# Patient Record
Sex: Female | Born: 1945
Health system: Southern US, Community
[De-identification: ages and names within clinical notes are randomized; demographics above are authoritative.]

## PROBLEM LIST (undated history)

## (undated) DIAGNOSIS — E119 Type 2 diabetes mellitus without complications: Secondary | ICD-10-CM

## (undated) DIAGNOSIS — G4730 Sleep apnea, unspecified: Secondary | ICD-10-CM

## (undated) DIAGNOSIS — E785 Hyperlipidemia, unspecified: Secondary | ICD-10-CM

## (undated) DIAGNOSIS — E039 Hypothyroidism, unspecified: Secondary | ICD-10-CM

## (undated) HISTORY — DX: Hyperlipidemia, unspecified: E78.5

## (undated) HISTORY — DX: Sleep apnea, unspecified: G47.30

## (undated) HISTORY — PX: HYSTERECTOMY ABDOMINAL WITH SALPINGO-OOPHORECTOMY: SHX6792

## (undated) HISTORY — DX: Type 2 diabetes mellitus without complications: E11.9

## (undated) HISTORY — DX: Hypothyroidism, unspecified: E03.9

---

## 2016-03-15 ENCOUNTER — Other Ambulatory Visit: Payer: Self-pay | Admitting: Family

## 2016-03-15 DIAGNOSIS — Z1231 Encounter for screening mammogram for malignant neoplasm of breast: Secondary | ICD-10-CM

## 2016-03-22 ENCOUNTER — Ambulatory Visit
Admission: RE | Admit: 2016-03-22 | Discharge: 2016-03-22 | Disposition: A | Discharge status: Home / Self Care | Payer: Medicare Other | Source: Ambulatory Visit | Attending: Family | Admitting: Family

## 2016-03-22 DIAGNOSIS — Z1231 Encounter for screening mammogram for malignant neoplasm of breast: Secondary | ICD-10-CM

## 2016-03-22 IMAGING — MG DIGITAL SCREENING BILATERAL MAMMOGRAM WITH CAD
7 series · 7 of 7 positions shown · non-contrast
Comparison: None.

CLINICAL DATA: Screening.

EXAM:
DIGITAL SCREENING BILATERAL MAMMOGRAM WITH CAD

[L CC (1 of 2)]
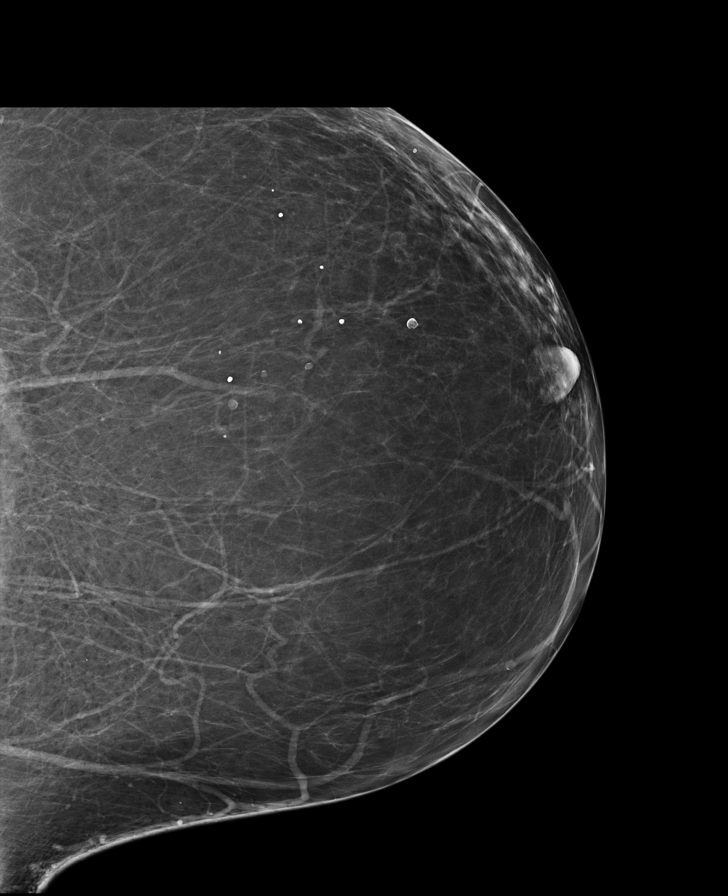

[R MLO (1 of 2)]
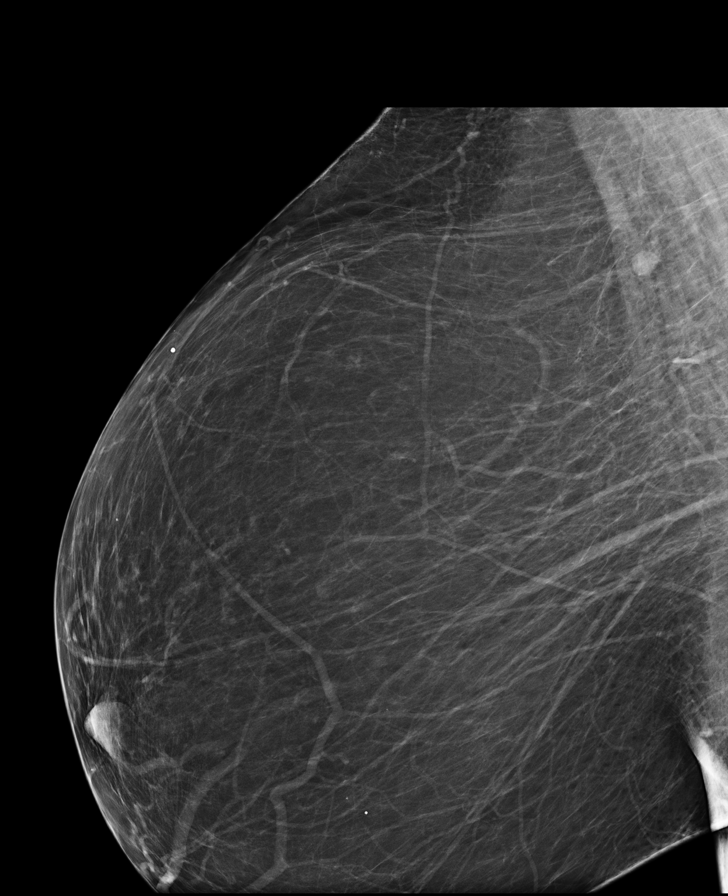

[R CC (1 of 2)]
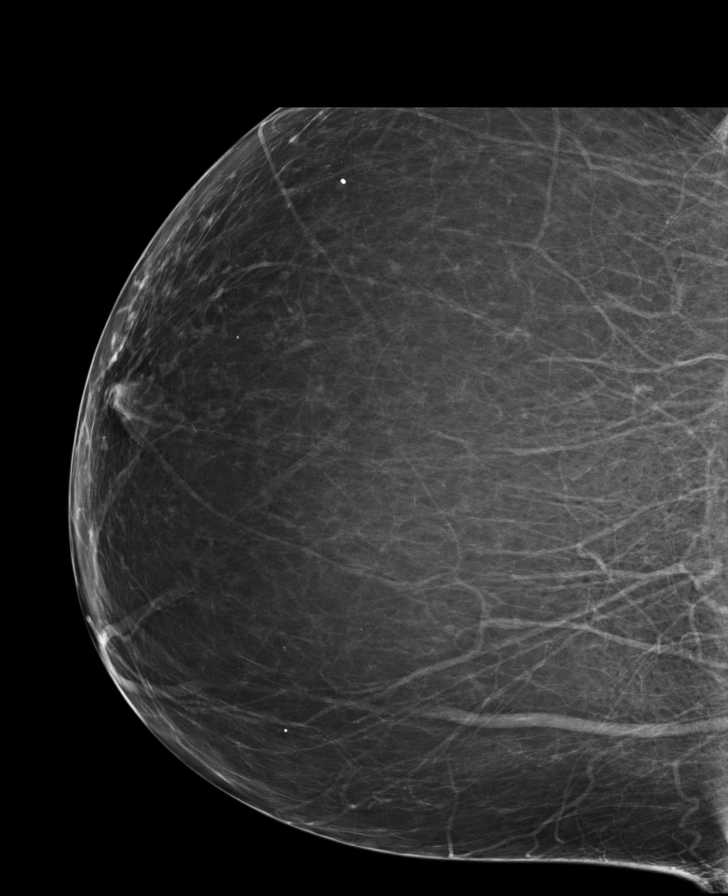

[R CC (2 of 2)]
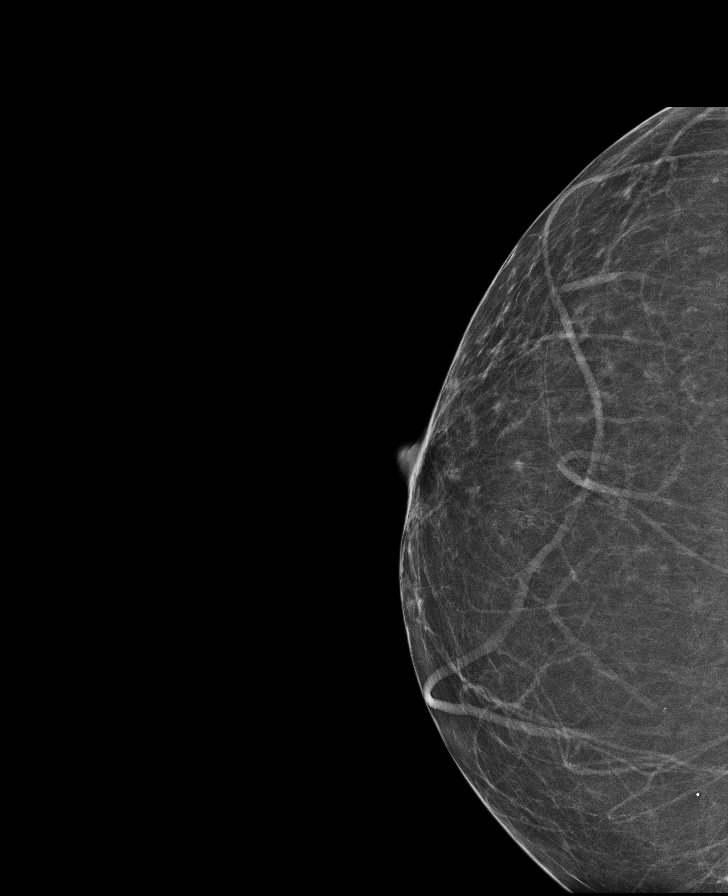

[L MLO]
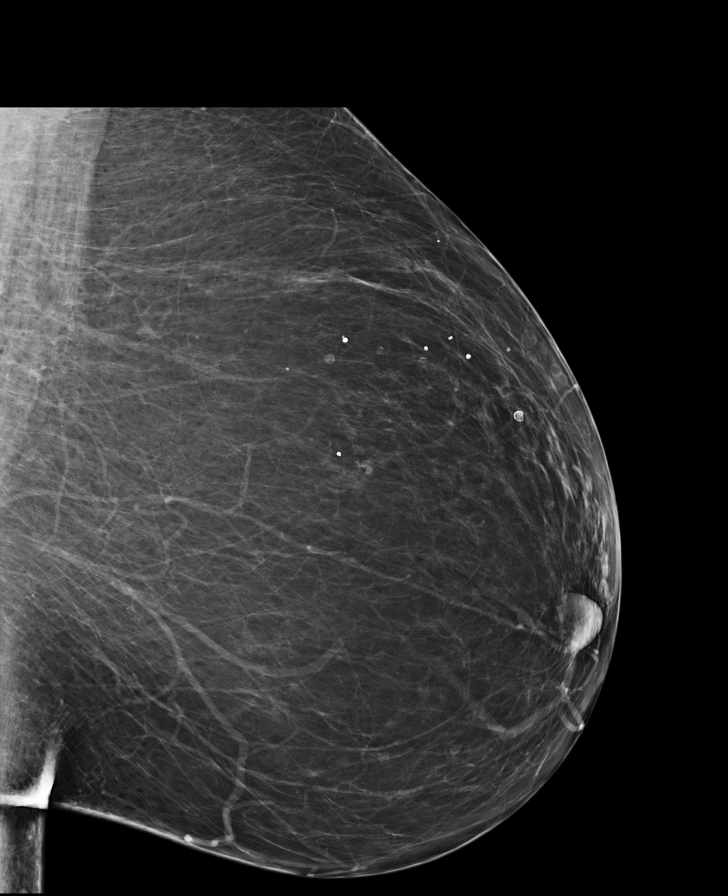

[R MLO (2 of 2)]
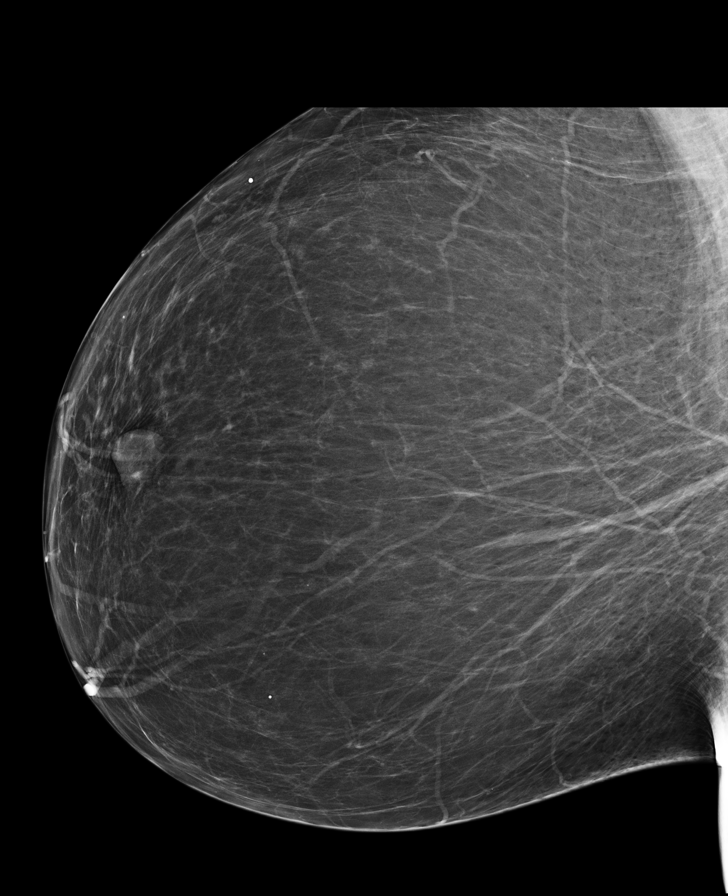

[L CC (2 of 2)]
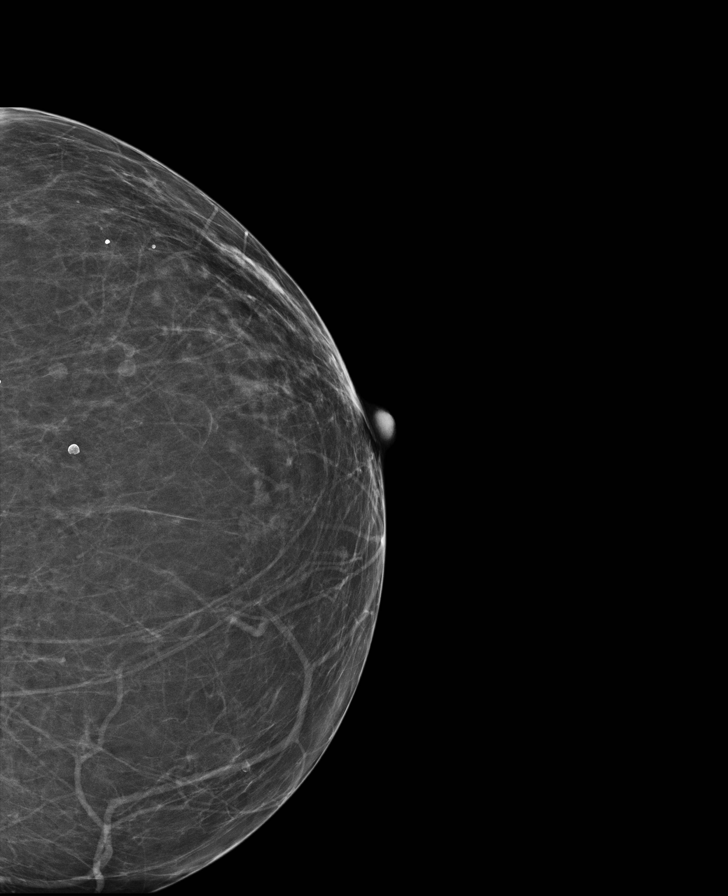

[7 of 7 positions shown; findings below may reference images not displayed]

ACR Breast Density Category b: There are scattered areas of
fibroglandular density.
FINDINGS: There are no findings suspicious for malignancy. Images were
processed with CAD.
IMPRESSION: No mammographic evidence of malignancy. A result letter of this
screening mammogram will be mailed directly to the patient.

RECOMMENDATION:
Screening mammogram in one year. (Code:[GD])

BI-RADS CATEGORY  1: Negative.

## 2016-08-08 ENCOUNTER — Ambulatory Visit: Payer: Medicare Other

## 2016-08-15 ENCOUNTER — Ambulatory Visit: Payer: Medicare Other

## 2016-11-07 ENCOUNTER — Ambulatory Visit (INDEPENDENT_AMBULATORY_CARE_PROVIDER_SITE_OTHER): Payer: Medicare Other | Admitting: Endocrinology

## 2016-11-07 ENCOUNTER — Encounter: Payer: Self-pay | Admitting: Endocrinology

## 2016-11-07 DIAGNOSIS — E785 Hyperlipidemia, unspecified: Secondary | ICD-10-CM | POA: Insufficient documentation

## 2016-11-07 DIAGNOSIS — E890 Postprocedural hypothyroidism: Secondary | ICD-10-CM

## 2016-11-07 DIAGNOSIS — E039 Hypothyroidism, unspecified: Secondary | ICD-10-CM | POA: Insufficient documentation

## 2016-11-07 DIAGNOSIS — E119 Type 2 diabetes mellitus without complications: Secondary | ICD-10-CM

## 2016-11-07 DIAGNOSIS — G4730 Sleep apnea, unspecified: Secondary | ICD-10-CM | POA: Insufficient documentation

## 2016-11-07 DIAGNOSIS — R748 Abnormal levels of other serum enzymes: Secondary | ICD-10-CM | POA: Diagnosis not present

## 2016-11-07 LAB — BASIC METABOLIC PANEL
BUN: 20 mg/dL (ref 6–23)
CO2: 31 mEq/L (ref 19–32)
Calcium: 10.1 mg/dL (ref 8.4–10.5)
Chloride: 103 mEq/L (ref 96–112)
Creatinine, Ser: 1.16 mg/dL (ref 0.40–1.20)
GFR: 59.22 mL/min — ABNORMAL LOW (ref >60.00)
Glucose, Bld: 133 mg/dL — ABNORMAL HIGH (ref 70–99)
Potassium: 3.8 mEq/L (ref 3.5–5.1)
Sodium: 140 mEq/L (ref 135–145)

## 2016-11-07 NOTE — Progress Notes (Signed)
   Subjective:    Patient ID: Karen Black, female    DOB: 09/10/1945, 71 y.o.   MRN: [id number]  HPI Pt is referred by Dorothy Scifres, PA, for elevated alk phos.  This was noted last month.  She denies h/o the following: bariatric surgery, renal disease, seizures, pancreatitis, heart disease, cancer, osteoporosis, malabsorption, eating disorder, bony fx, neck surgery, chelation rx, and vitamin-D deficiency.  She has slight cold intolerance throughout the body, and assoc fatigue.  She does not take a vit-D supplement.    Past Medical History:  Diagnosis Date  . Diabetes ([facility])   . Dyslipidemia   . Hypothyroidism   . Sleep apnea     No past surgical history on file.  Social History   Social History  . Marital status: Divorced    Spouse name: N/A  . Number of children: N/A  . Years of education: N/A   Occupational History  . Not on file.   Social History Main Topics  . Smoking status: Former Smoker  . Smokeless tobacco: Never Used  . Alcohol use No  . Drug use: Unknown  . Sexual activity: Not on file   Other Topics Concern  . Not on file   Social History Narrative  . No narrative on file    No current outpatient prescriptions on file prior to visit.   No current facility-administered medications on file prior to visit.     No Known Allergies  No family history on file.  BP 132/86   Pulse 66   Ht 5' 4.5" (1.638 m)   Wt 208 lb (94.3 kg)   SpO2 90%   BMI 35.15 kg/m    Review of Systems denies cramps, muscle weakness, n/v, syncope, menopausal sxs, palpitations, rash, diarrhea, sob, weight loss, numbness, fever, urinary frequency, rhinorrhea, easy bruising, and blurry vision.      Objective:   Physical Exam VS: see vs page GEN: no distress HEAD: head: no deformity eyes: no periorbital swelling; moderate bilateral proptosis.   external nose and ears are normal mouth: no lesion seen NECK: supple, thyroid is not enlarged CHEST WALL: no deformity LUNGS:  clear to auscultation CV: reg rate and rhythm, no murmur ABD: abdomen is soft, nontender.  no hepatosplenomegaly.  not distended.  no hernia MUSCULOSKELETAL: muscle bulk and strength are grossly normal.  no obvious joint swelling.  gait is normal and steady EXTEMITIES: no deformity.  no edema PULSES: no carotid bruit NEURO:  cn 2-12 grossly intact.   readily moves all 4's.  sensation is intact to touch on all 4's SKIN:  Normal texture and temperature.  No rash or suspicious lesion is visible.   NODES:  None palpable at the neck PSYCH: alert, well-oriented.  Does not appear anxious nor depressed.   Our office has called PCP office, to request more records  outside test results are reviewed: AP=185 GGT=24 PTH=24 25-OH vit-D=40 TSH=normal     Assessment & Plan:  Elevated alk phos, new, uncertain etiology.  w/u needed. Fatigue and other sxs, not thyroid-related.   Patient Instructions  blood tests are requested for you today.  We'll let you know about the results. Let's check an ultrasound.  you will receive a phone call, about a day and time for an appointment   

## 2016-11-07 NOTE — Patient Instructions (Signed)
blood tests are requested for you today.  We'll let you know about the results. Let's check an ultrasound.  you will receive a phone call, about a day and time for an appointment

## 2016-11-10 ENCOUNTER — Other Ambulatory Visit: Payer: Self-pay | Admitting: Endocrinology

## 2016-11-10 DIAGNOSIS — R748 Abnormal levels of other serum enzymes: Secondary | ICD-10-CM

## 2016-11-10 LAB — ALKALINE PHOSPHATASE ISOENZYMES
Alkaline Phonsphatase: 173 U/L — ABNORMAL HIGH (ref 33–130)
Bone Isoenzymes: 70 % — ABNORMAL HIGH (ref 28–66)
Intestinal Isoenzymes: 2 % (ref 1–24)
Liver Isoenzymes: 28 % (ref 25–69)

## 2016-11-13 ENCOUNTER — Other Ambulatory Visit: Payer: Self-pay | Admitting: Endocrinology

## 2016-11-13 DIAGNOSIS — R748 Abnormal levels of other serum enzymes: Secondary | ICD-10-CM

## 2016-11-21 ENCOUNTER — Ambulatory Visit
Admission: RE | Admit: 2016-11-21 | Discharge: 2016-11-21 | Disposition: A | Discharge status: Home / Self Care | Payer: Medicare Other | Source: Ambulatory Visit | Attending: Endocrinology | Admitting: Endocrinology

## 2016-11-21 DIAGNOSIS — R748 Abnormal levels of other serum enzymes: Secondary | ICD-10-CM

## 2016-11-27 ENCOUNTER — Other Ambulatory Visit: Payer: Self-pay | Admitting: Endocrinology

## 2016-11-27 ENCOUNTER — Telehealth: Payer: Self-pay

## 2016-11-27 ENCOUNTER — Encounter (HOSPITAL_COMMUNITY)
Admission: RE | Admit: 2016-11-27 | Discharge: 2016-11-27 | Disposition: A | Discharge status: Home / Self Care | Payer: Medicare Other | Source: Ambulatory Visit | Attending: Endocrinology | Admitting: Endocrinology

## 2016-11-27 DIAGNOSIS — R748 Abnormal levels of other serum enzymes: Secondary | ICD-10-CM | POA: Diagnosis not present

## 2016-11-27 DIAGNOSIS — R948 Abnormal results of function studies of other organs and systems: Secondary | ICD-10-CM | POA: Diagnosis not present

## 2016-11-27 IMAGING — NM NM BONE WHOLE BODY
2 series · 2 of 2 positions shown · non-contrast
Comparison: None.

CLINICAL DATA: Elevated alkaline phosphatase

EXAM:
NUCLEAR MEDICINE WHOLE BODY BONE SCAN
TECHNIQUE: Whole body anterior and posterior images were obtained approximately
3 hours after intravenous injection of radiopharmaceutical.
RADIOPHARMACEUTICALS:  21.3 mCi [52] MDP IV

[Series 1: wbr_bone_40 whole body · 2.66mm/px · 1 of 1 slices shown (1 of 2)]
[im 1/1]
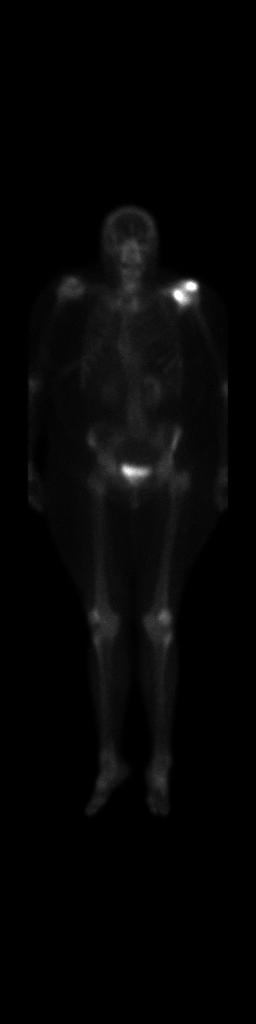

[Series 1: wbr_bone_40 whole body · 2.66mm/px · 1 of 1 slices shown (2 of 2)]
[im 1/1]
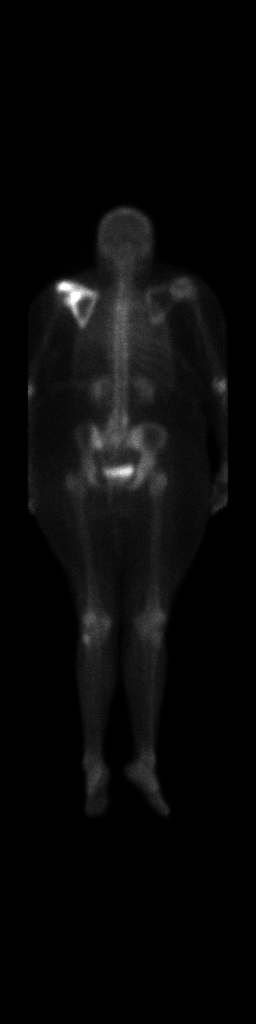

[2 of 2 positions shown; findings below may reference images not displayed]

FINDINGS: There is adequate uptake of radioactive tracer throughout the bony
skeleton. Bilateral renal activity is noted with pooling in the
urinary bladder. Diffuse degenerative changes are noted throughout
the skeletal system.

There is significant increased uptake involving the left scapula
diffusely along its margin but most particularly in the region of
the acromion and glenoid. No other significant focal area of
increased uptake is noted.
IMPRESSION: Increased uptake within the left scapula particularly within the
region of the glenoid and acromion. Correlation with plain film of
the shoulder is recommended. Cross-sectional imaging may be
necessary as well.

These results will be called to the ordering clinician or
representative by the Radiologist Assistant, and communication
documented in the PACS or zVision Dashboard.

## 2016-11-27 MED ORDER — TECHNETIUM TC 99M MEDRONATE IV KIT
25.0000 | PACK | Freq: Once | INTRAVENOUS | Status: AC | PRN
  Administered 2016-11-27: 21.3 via INTRAVENOUS

## 2016-11-27 NOTE — Telephone Encounter (Signed)
Call report from [facility] for the [facility] Whole Body Scan:  Impression showed increase uptake between the left scapula. Particular the region of the glenoid and acromion. Correlation of plain films is recommended and cross sectional imaging maybe neccessary as well.

## 2016-12-05 ENCOUNTER — Telehealth: Payer: Self-pay

## 2016-12-05 NOTE — Telephone Encounter (Signed)
Ok.  I have ordered

## 2016-12-05 NOTE — Telephone Encounter (Signed)
Notes recorded by Renato Shin, MD on 11/27/2016 at 6:25 PM EDT please call patient: Normal except at the left shoulder blade. Have you ever had problems there? Please go back to x-ray to have a regular x-ray taken of that area. I'll let you know what we find.  Patient notified of message and stated she has been experiencing some pain in the left shoulder. She agreed to have the regular x-ray.

## 2016-12-18 ENCOUNTER — Telehealth: Payer: Self-pay | Admitting: Endocrinology

## 2016-12-18 NOTE — Telephone Encounter (Signed)
Pt is asking about the xrays she thought she was to have, there is no order please advise

## 2016-12-19 ENCOUNTER — Telehealth: Payer: Self-pay

## 2016-12-19 NOTE — Telephone Encounter (Signed)
according to epic, the x-ray was ordered on 11/27/16.  Please go to x-ray to have done.

## 2016-12-19 NOTE — Telephone Encounter (Signed)
Called and notified patient.

## 2016-12-19 NOTE — Telephone Encounter (Signed)
Please advise. Thank you

## 2016-12-19 NOTE — Telephone Encounter (Signed)
Called and advised patient that order was placed, she understood and will wait for them to contact her.

## 2017-07-19 ENCOUNTER — Ambulatory Visit: Payer: Medicare Other | Admitting: Endocrinology

## 2017-07-19 ENCOUNTER — Ambulatory Visit
Admission: RE | Admit: 2017-07-19 | Discharge: 2017-07-19 | Disposition: A | Discharge status: Home / Self Care | Payer: Medicare Other | Source: Ambulatory Visit | Attending: Endocrinology | Admitting: Endocrinology

## 2017-07-19 ENCOUNTER — Encounter: Payer: Self-pay | Admitting: Endocrinology

## 2017-07-19 VITALS — BP 132/70 | HR 62 | Wt 203.2 lb

## 2017-07-19 DIAGNOSIS — M889 Osteitis deformans of unspecified bone: Secondary | ICD-10-CM

## 2017-07-19 DIAGNOSIS — R748 Abnormal levels of other serum enzymes: Secondary | ICD-10-CM

## 2017-07-19 LAB — HEPATIC FUNCTION PANEL
ALT: 13 U/L (ref 0–35)
AST: 19 U/L (ref 0–37)
Albumin: 4.1 g/dL (ref 3.5–5.2)
Alkaline Phosphatase: 169 U/L — ABNORMAL HIGH (ref 39–117)
Bilirubin, Direct: 0.2 mg/dL (ref 0.0–0.3)
Total Bilirubin: 0.6 mg/dL (ref 0.2–1.2)
Total Protein: 7.6 g/dL (ref 6.0–8.3)

## 2017-07-19 LAB — VITAMIN D 25 HYDROXY (VIT D DEFICIENCY, FRACTURES): VITD: 36.86 ng/mL (ref 30.00–100.00)

## 2017-07-19 NOTE — Progress Notes (Signed)
Subjective:    Patient ID: Karen Black, female    DOB: 07-02-1946, 72 y.o.   MRN: [id number]  HPI  Pt returns for f/u of elevated alk phos (dx'ed 2018; isoenzymes indicated bone cause, but vit-d, PTH, and creat are normal; bone scan was abnormal at the left shoulder; hepatic US showed only NASH).  She did not do the x-ray.  pt denies left shoulder pain.   Past Medical History:  Diagnosis Date  . Diabetes ([place])   . Dyslipidemia   . Hypothyroidism   . Sleep apnea     History reviewed. No pertinent surgical history.  Social History   Socioeconomic History  . Marital status: Divorced    Spouse name: Not on file  . Number of children: Not on file  . Years of education: Not on file  . Highest education level: Not on file  Social Needs  . Financial resource strain: Not on file  . Food insecurity - worry: Not on file  . Food insecurity - inability: Not on file  . Transportation needs - medical: Not on file  . Transportation needs - non-medical: Not on file  Occupational History  . Not on file  Tobacco Use  . Smoking status: Former Research scientist (life sciences)  . Smokeless tobacco: Never Used  Substance and Sexual Activity  . Alcohol use: No  . Drug use: Not on file  . Sexual activity: Not on file  Other Topics Concern  . Not on file  Social History Narrative  . Not on file    Current Outpatient Medications on File Prior to Visit  Medication Sig Dispense Refill  . amLODipine (NORVASC) 5 MG tablet Take 5 mg by mouth daily.    Marland Kitchen atorvastatin (LIPITOR) 10 MG tablet     . levothyroxine (SYNTHROID, LEVOTHROID) 75 MCG tablet     . metFORMIN (GLUCOPHAGE-XR) 500 MG 24 hr tablet Take 500 mg by mouth every morning.    . propranolol ER (INDERAL LA) 160 MG SR capsule     . quinapril (ACCUPRIL) 40 MG tablet 40 mg 2 (two) times daily.     Marland Kitchen triamterene-hydrochlorothiazide (DYAZIDE) 37.5-25 MG capsule Take 1 capsule by mouth daily.     No current facility-administered medications on file prior to  visit.     No Known Allergies  History reviewed. No pertinent family history.  BP 132/70 (BP Location: Left Arm, Patient Position: Sitting, Cuff Size: Normal)   Pulse 62   Wt 203 lb 3.2 oz (92.2 kg)   SpO2 97%   BMI 34.34 kg/m    Review of Systems She has leg cramps.      Objective:   Physical Exam VITAL SIGNS:  See vs page GENERAL: no distress Left shoulder: full rom without pain.  Nontender.    X-rays: paget's    Assessment & Plan:  Paget's Dz, new.  No treatment is needed now  Patient Instructions  blood tests and x-rays are requested for you today.  We'll let you know about the results.   The fatty liver seen on ultrasound is a separate problem.  Weight loss helps.   Please come back for a follow-up appointment in 6 months.

## 2017-07-19 NOTE — Patient Instructions (Addendum)
blood tests and x-rays are requested for you today.  We'll let you know about the results.   The fatty liver seen on ultrasound is a separate problem.  Weight loss helps.   Please come back for a follow-up appointment in 6 months.

## 2017-07-20 LAB — PTH, INTACT AND CALCIUM
Calcium: 9.9 mg/dL (ref 8.6–10.4)
PTH: 27 pg/mL (ref 14–64)

## 2017-07-21 DIAGNOSIS — M889 Osteitis deformans of unspecified bone: Secondary | ICD-10-CM | POA: Insufficient documentation

## 2017-07-23 ENCOUNTER — Telehealth: Payer: Self-pay | Admitting: Endocrinology

## 2017-07-23 NOTE — Telephone Encounter (Signed)
Patient returning Sarah's call- she is waiting for a call back re: her lab/test results Ph# [phone]

## 2017-07-23 NOTE — Telephone Encounter (Signed)
I called & notified patient of her lab results.

## 2017-07-23 NOTE — Telephone Encounter (Signed)
Pt returned call about getting her results.   Please Advise

## 2017-10-08 ENCOUNTER — Other Ambulatory Visit: Payer: Self-pay | Admitting: Family Medicine

## 2017-10-08 DIAGNOSIS — Z1231 Encounter for screening mammogram for malignant neoplasm of breast: Secondary | ICD-10-CM

## 2017-10-22 ENCOUNTER — Encounter: Payer: Self-pay | Admitting: Endocrinology

## 2017-10-22 ENCOUNTER — Ambulatory Visit: Payer: Medicare Other | Admitting: Endocrinology

## 2017-10-22 VITALS — BP 152/80 | HR 60 | Wt 200.2 lb

## 2017-10-22 DIAGNOSIS — M889 Osteitis deformans of unspecified bone: Secondary | ICD-10-CM | POA: Diagnosis not present

## 2017-10-22 NOTE — Progress Notes (Signed)
Subjective:    Patient ID: Karen Black, female    DOB: 1945-07-28, 72 y.o.   MRN: [id number]  HPI Pt returns for f/u of paget's Dz (dx'ed 2018, when AP was high; isoenzymes indicated bone cause, but Vit-D, PTH, and creat were normal; bone scan was abnormal only at the left shoulder; hepatic US showed only NASH; plain films showed Paget's).  Pt says left shoulder is painless.  She missed her last dose of BP meds.   Past Medical History:  Diagnosis Date  . Diabetes ([place])   . Dyslipidemia   . Hypothyroidism   . Sleep apnea     History reviewed. No pertinent surgical history.  Social History   Socioeconomic History  . Marital status: Divorced    Spouse name: Not on file  . Number of children: Not on file  . Years of education: Not on file  . Highest education level: Not on file  Occupational History  . Not on file  Social Needs  . Financial resource strain: Not on file  . Food insecurity:    Worry: Not on file    Inability: Not on file  . Transportation needs:    Medical: Not on file    Non-medical: Not on file  Tobacco Use  . Smoking status: Former Research scientist (life sciences)  . Smokeless tobacco: Never Used  Substance and Sexual Activity  . Alcohol use: No  . Drug use: Not on file  . Sexual activity: Not on file  Lifestyle  . Physical activity:    Days per week: Not on file    Minutes per session: Not on file  . Stress: Not on file  Relationships  . Social connections:    Talks on phone: Not on file    Gets together: Not on file    Attends religious service: Not on file    Active member of club or organization: Not on file    Attends meetings of clubs or organizations: Not on file    Relationship status: Not on file  . Intimate partner violence:    Fear of current or ex partner: Not on file    Emotionally abused: Not on file    Physically abused: Not on file    Forced sexual activity: Not on file  Other Topics Concern  . Not on file  Social History Narrative  . Not on file     Current Outpatient Medications on File Prior to Visit  Medication Sig Dispense Refill  . amLODipine (NORVASC) 5 MG tablet Take 5 mg by mouth daily.    Marland Kitchen atorvastatin (LIPITOR) 10 MG tablet     . levothyroxine (SYNTHROID, LEVOTHROID) 75 MCG tablet     . metFORMIN (GLUCOPHAGE-XR) 500 MG 24 hr tablet Take 500 mg by mouth every morning.    . propranolol ER (INDERAL LA) 160 MG SR capsule     . quinapril (ACCUPRIL) 40 MG tablet 40 mg 2 (two) times daily.     Marland Kitchen triamterene-hydrochlorothiazide (DYAZIDE) 37.5-25 MG capsule Take 1 capsule by mouth daily.     No current facility-administered medications on file prior to visit.     No Known Allergies  History reviewed. No pertinent family history.  BP (!) 152/80 (BP Location: Left Arm, Patient Position: Sitting, Cuff Size: Normal)   Pulse 60   Wt 200 lb 3.2 oz (90.8 kg)   SpO2 96%   BMI 33.83 kg/m   Review of Systems Denies left hand weakness and numbness.  Objective:   Physical Exam VITAL SIGNS:  See vs page.  GENERAL: no distress.  Left shoulder: full ROM, without pain.  It is nontender.       Assessment & Plan:  HTN: is noted today Paget's Dz.  No rx is needed now.    Patient Instructions  Your blood pressure is high today.  Please see your primary care provider soon, to have it rechecked. Please come back for a follow-up appointment in 6 months.

## 2017-10-22 NOTE — Patient Instructions (Addendum)
Your blood pressure is high today.  Please see your primary care provider soon, to have it rechecked. Please come back for a follow-up appointment in 6 months.

## 2017-10-25 ENCOUNTER — Ambulatory Visit
Admission: RE | Admit: 2017-10-25 | Discharge: 2017-10-25 | Disposition: A | Discharge status: Home / Self Care | Payer: Medicare Other | Source: Ambulatory Visit | Attending: Family Medicine | Admitting: Family Medicine

## 2017-10-25 DIAGNOSIS — Z1231 Encounter for screening mammogram for malignant neoplasm of breast: Secondary | ICD-10-CM

## 2017-10-25 IMAGING — MG DIGITAL SCREENING BILATERAL MAMMOGRAM WITH TOMO AND CAD
3 series · 3 of 11 positions shown · non-contrast
Comparison: Previous exam(s).

ACR Breast Density Category a: The breast tissue is almost entirely
fatty.

CLINICAL DATA: Screening.

EXAM:
DIGITAL SCREENING BILATERAL MAMMOGRAM WITH TOMO AND CAD

[R MLO synth-2D]
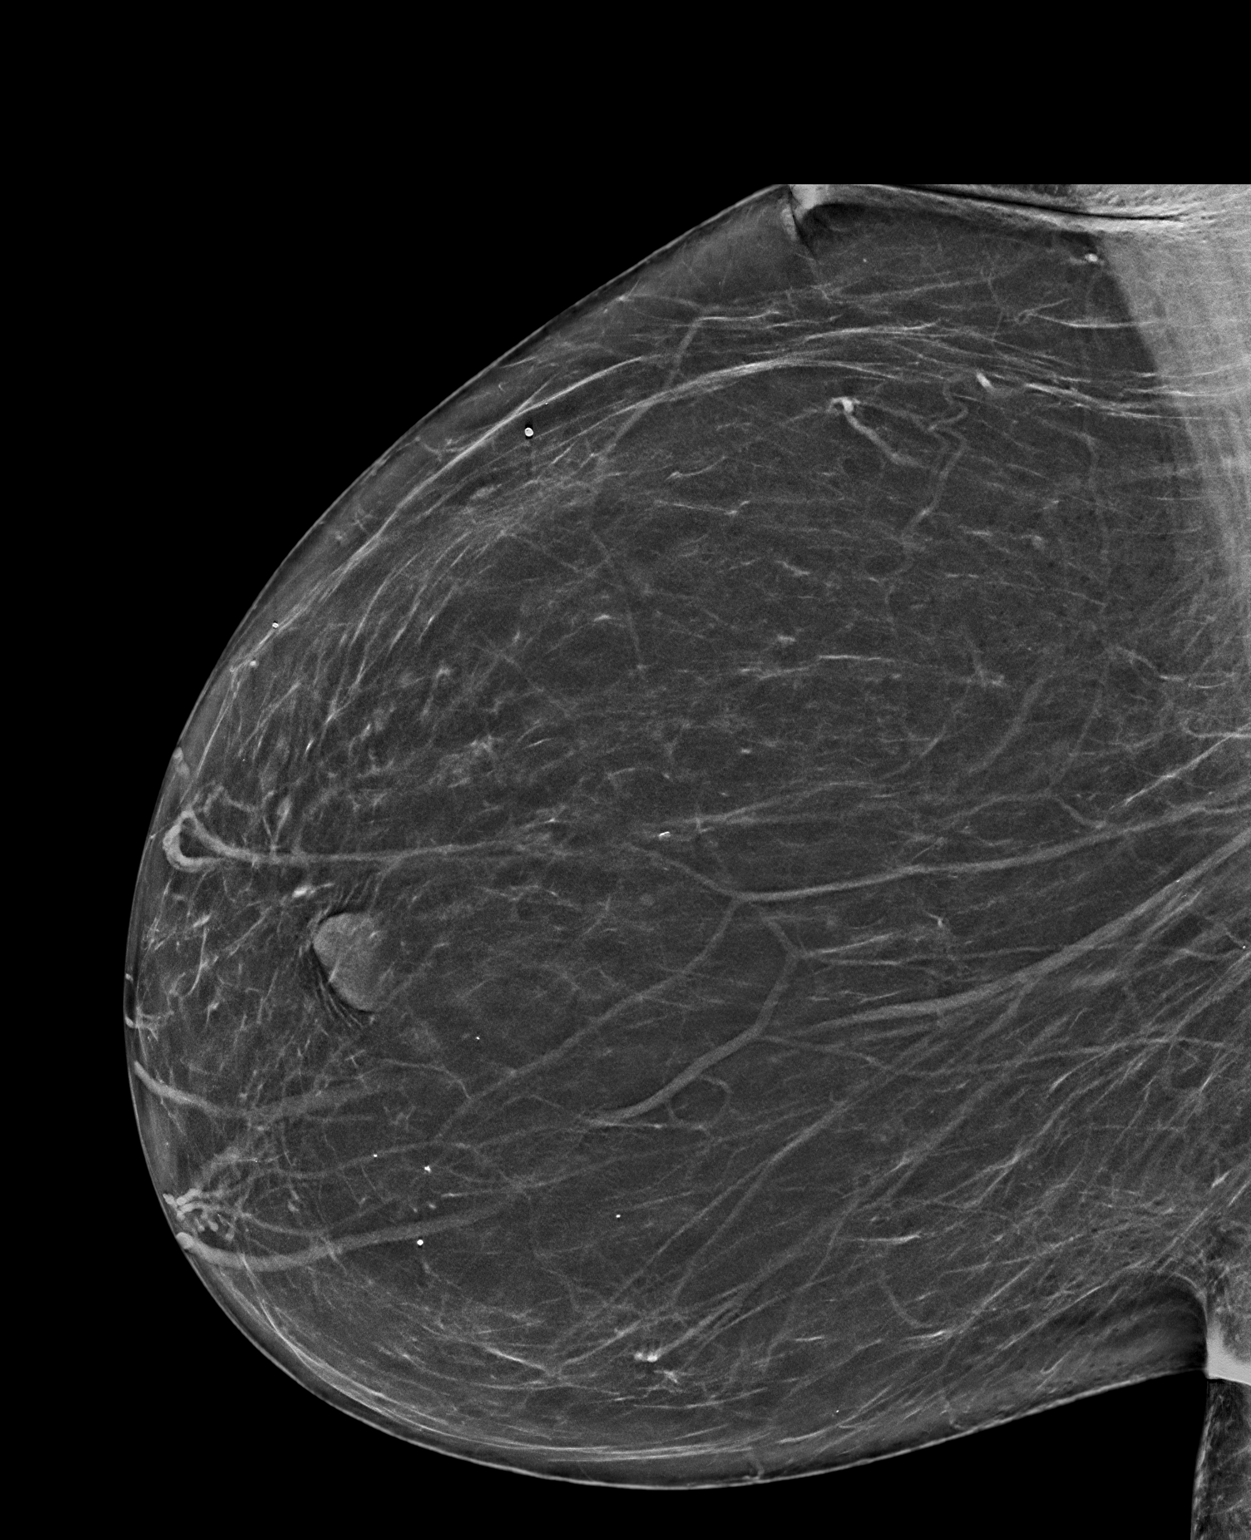

[R MLO tomo · tomo slice 37/73.0]
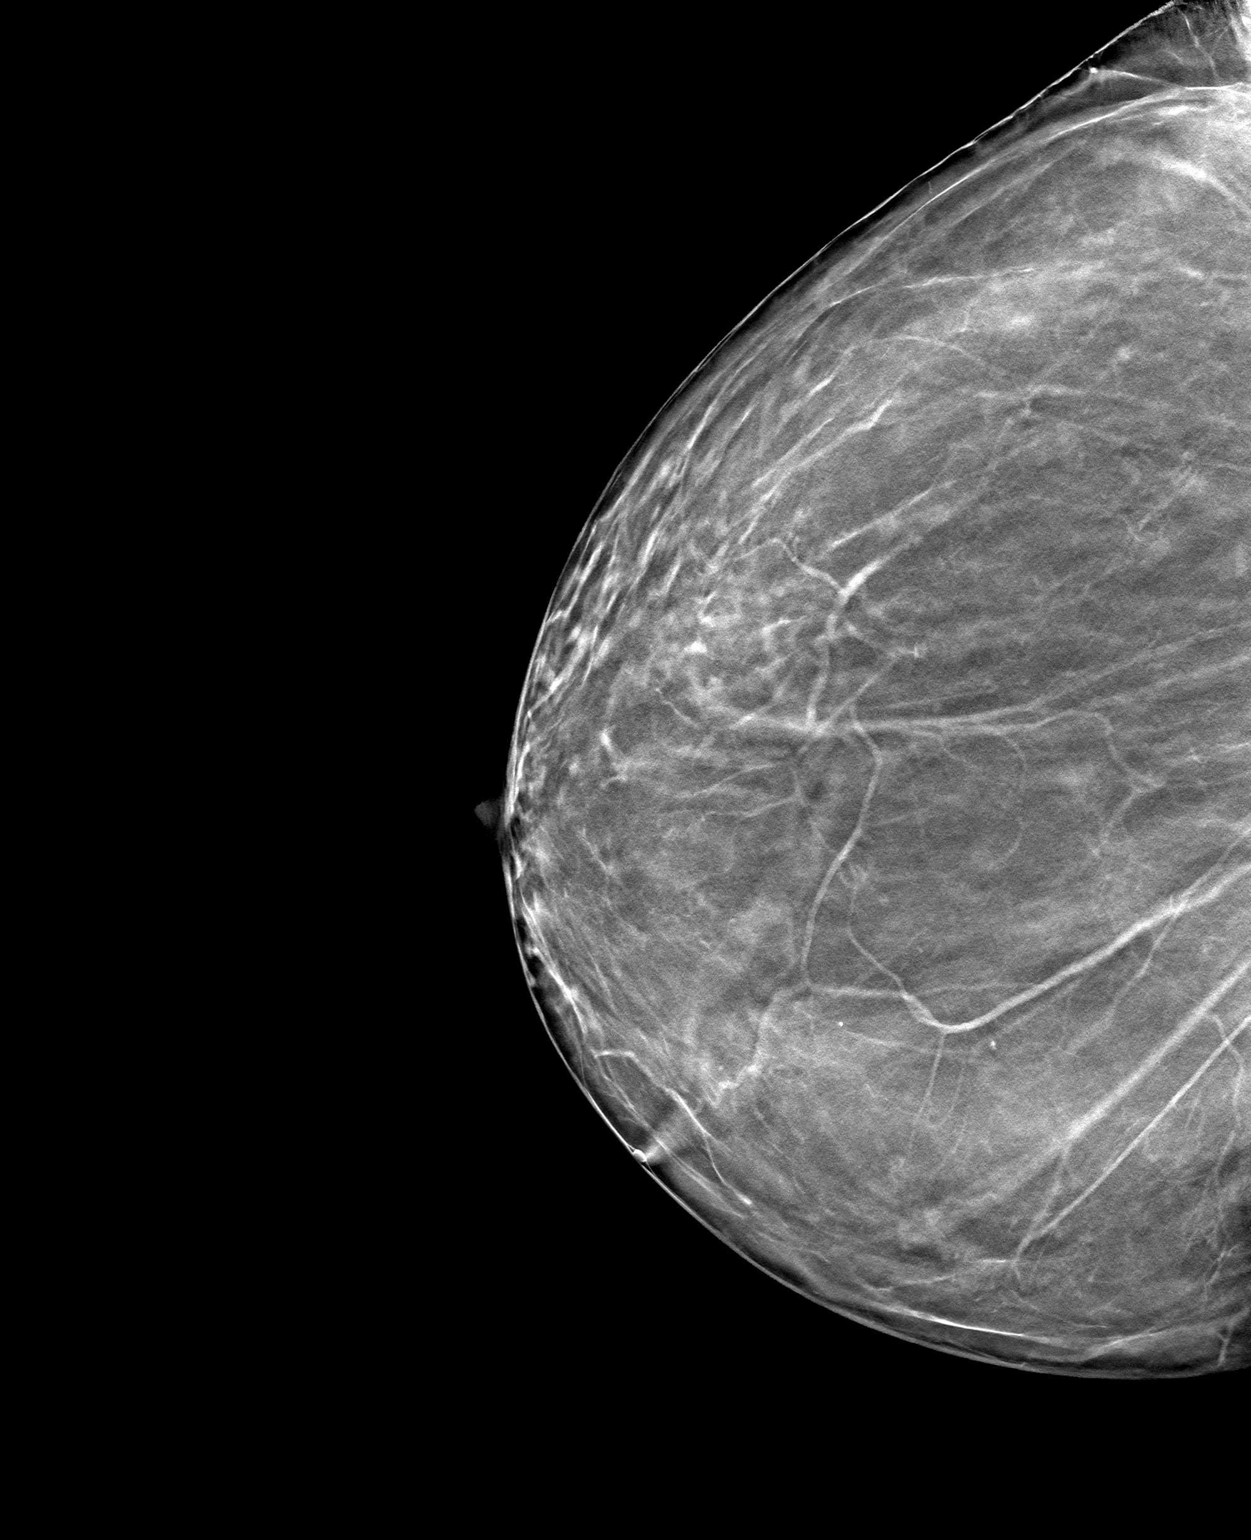

[R CC tomo · tomo slice 33/65.0]
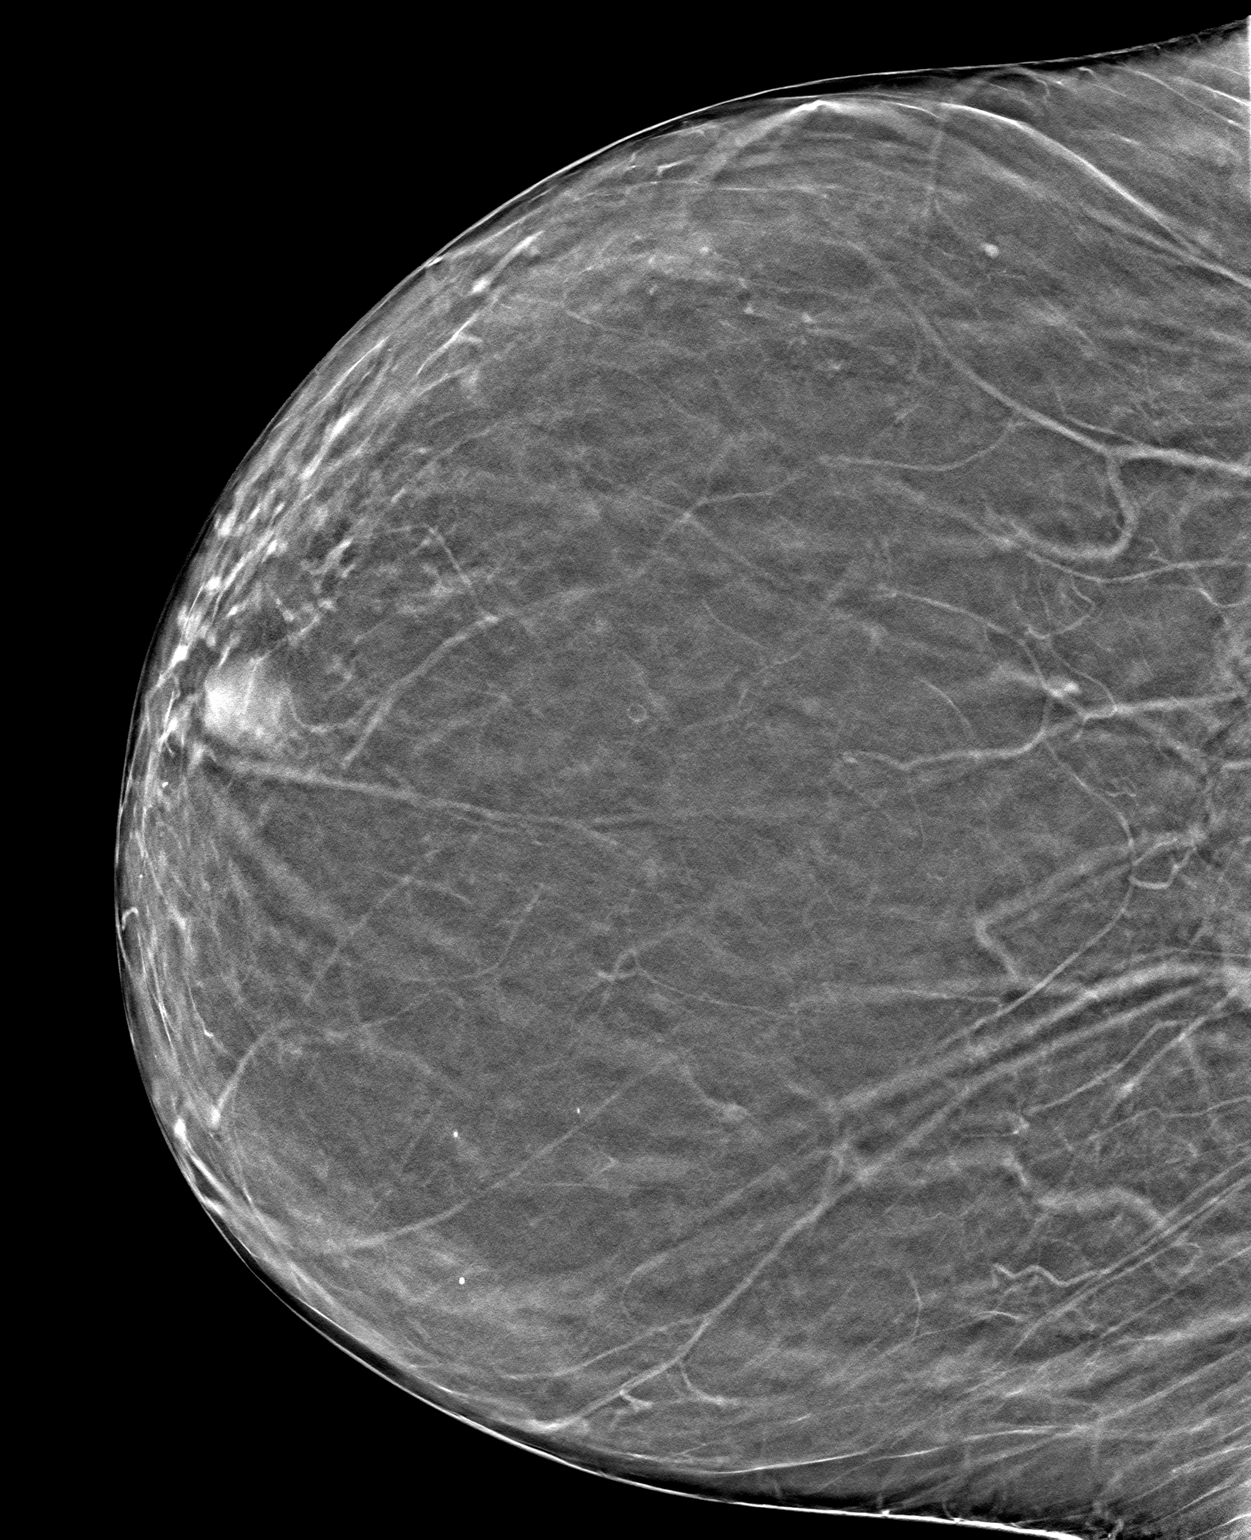

[3 of 11 positions shown; findings below may reference images not displayed]

FINDINGS: There are no findings suspicious for malignancy. Images were
processed with CAD.
IMPRESSION: No mammographic evidence of malignancy. A result letter of this
screening mammogram will be mailed directly to the patient.

RECOMMENDATION:
Screening mammogram in one year. (Code:[TA])

BI-RADS CATEGORY  1: Negative.

## 2018-01-15 DIAGNOSIS — G4733 Obstructive sleep apnea (adult) (pediatric): Secondary | ICD-10-CM | POA: Diagnosis not present

## 2018-01-15 DIAGNOSIS — I10 Essential (primary) hypertension: Secondary | ICD-10-CM | POA: Diagnosis not present

## 2018-01-15 DIAGNOSIS — E785 Hyperlipidemia, unspecified: Secondary | ICD-10-CM | POA: Diagnosis not present

## 2018-01-15 DIAGNOSIS — E890 Postprocedural hypothyroidism: Secondary | ICD-10-CM | POA: Diagnosis not present

## 2018-01-15 DIAGNOSIS — E1169 Type 2 diabetes mellitus with other specified complication: Secondary | ICD-10-CM | POA: Diagnosis not present

## 2018-01-15 DIAGNOSIS — Z136 Encounter for screening for cardiovascular disorders: Secondary | ICD-10-CM | POA: Diagnosis not present

## 2018-01-16 ENCOUNTER — Ambulatory Visit: Payer: Medicare Other | Admitting: Endocrinology

## 2018-02-01 DIAGNOSIS — E785 Hyperlipidemia, unspecified: Secondary | ICD-10-CM | POA: Diagnosis not present

## 2018-02-20 DIAGNOSIS — M25511 Pain in right shoulder: Secondary | ICD-10-CM | POA: Diagnosis not present

## 2018-02-21 ENCOUNTER — Ambulatory Visit
Admission: RE | Admit: 2018-02-21 | Discharge: 2018-02-21 | Disposition: A | Discharge status: Home / Self Care | Payer: Medicare Other | Source: Ambulatory Visit | Attending: Family Medicine | Admitting: Family Medicine

## 2018-02-21 ENCOUNTER — Other Ambulatory Visit: Payer: Self-pay | Admitting: Family Medicine

## 2018-02-21 DIAGNOSIS — R52 Pain, unspecified: Secondary | ICD-10-CM

## 2018-02-21 DIAGNOSIS — M19011 Primary osteoarthritis, right shoulder: Secondary | ICD-10-CM | POA: Diagnosis not present

## 2018-02-21 IMAGING — DX DG SHOULDER 2+V*R*
3 series · 3 of 3 positions shown · non-contrast
Comparison: None.

CLINICAL DATA: 72-year-old female with a history of right shoulder
pain

EXAM:
RIGHT SHOULDER - 2+ VIEW

[dg shoulder right (1 of 3)]
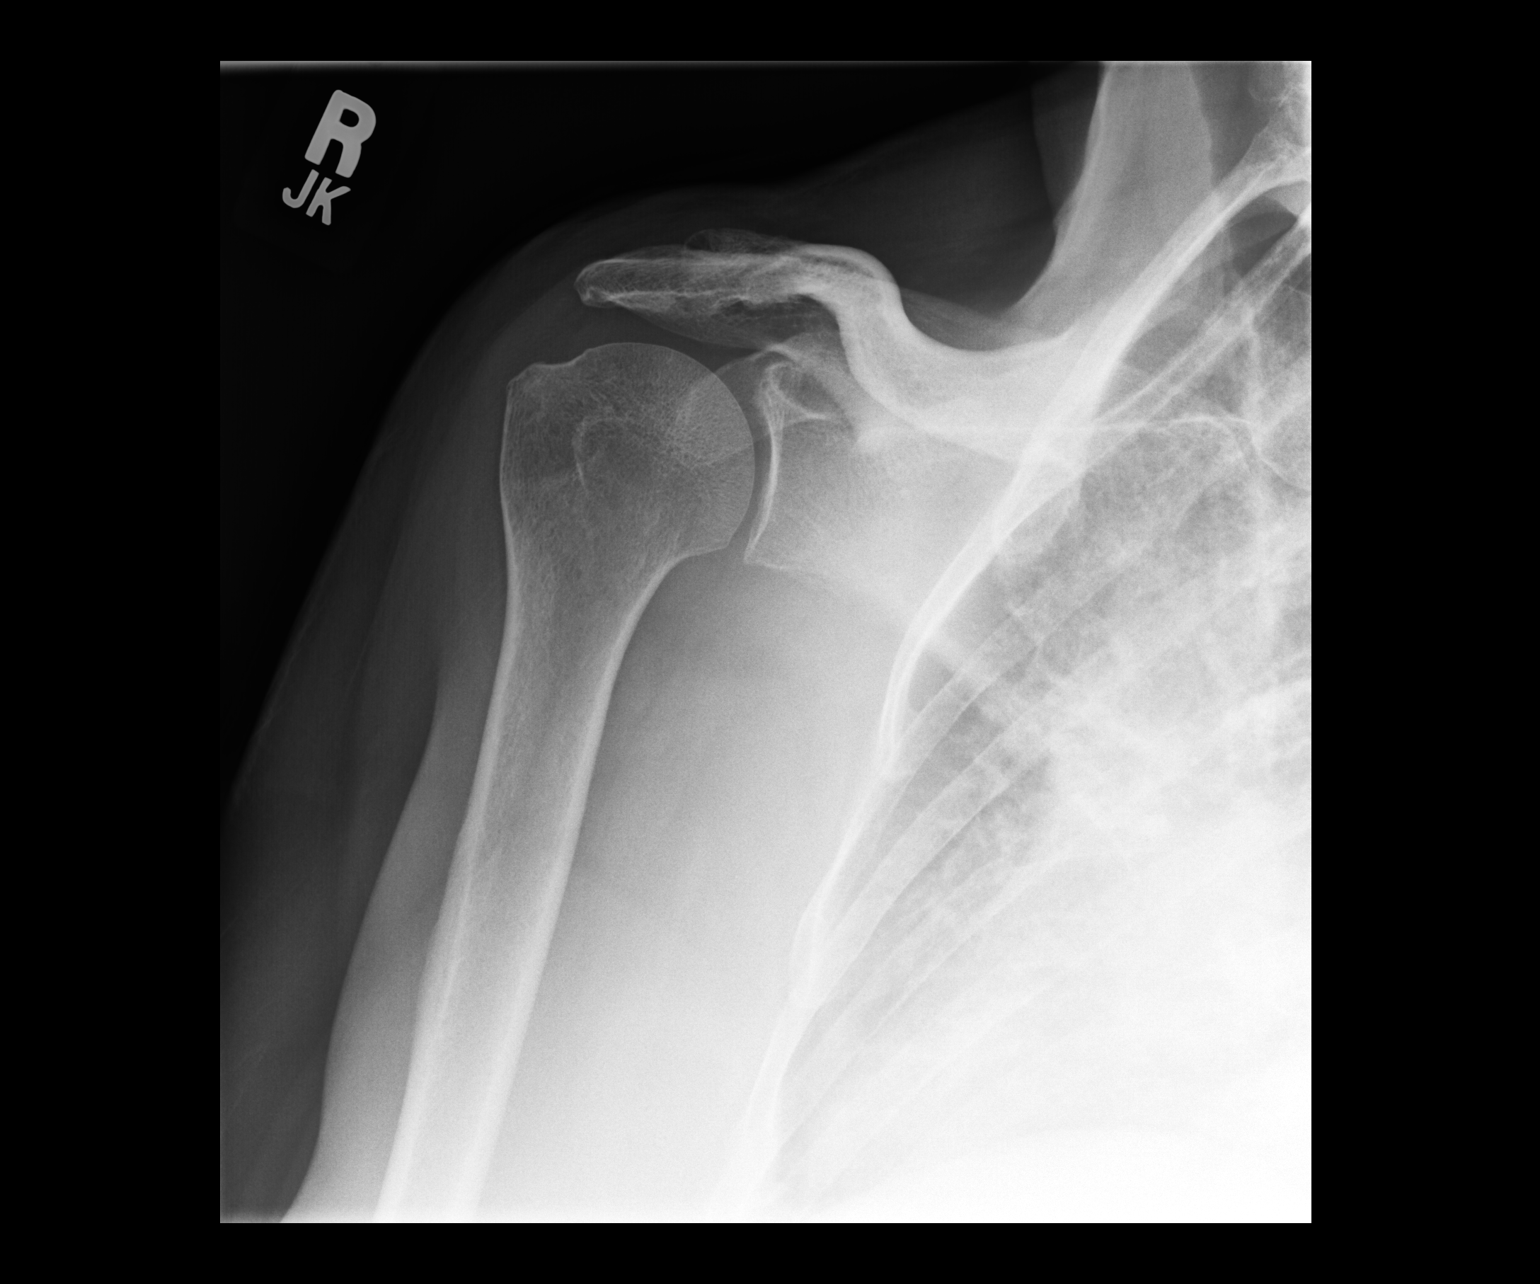

[dg shoulder right (2 of 3)]
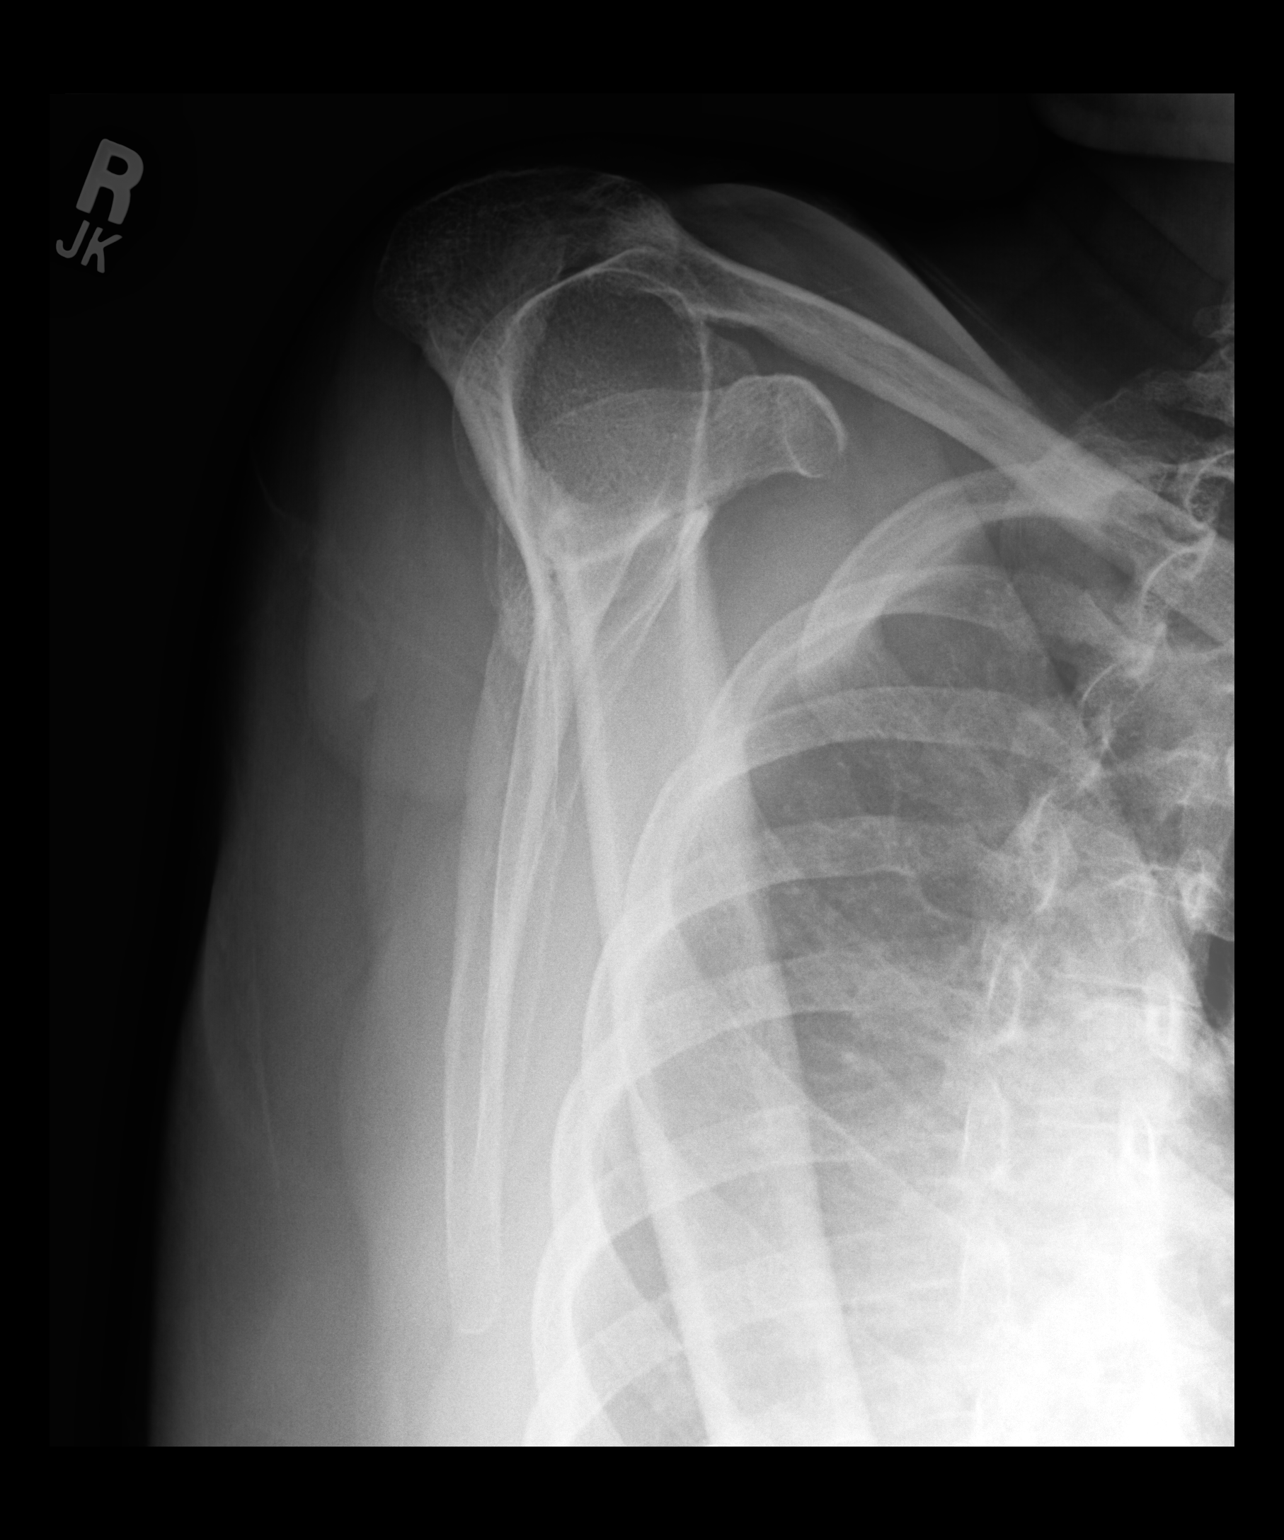

[dg shoulder right (3 of 3)]
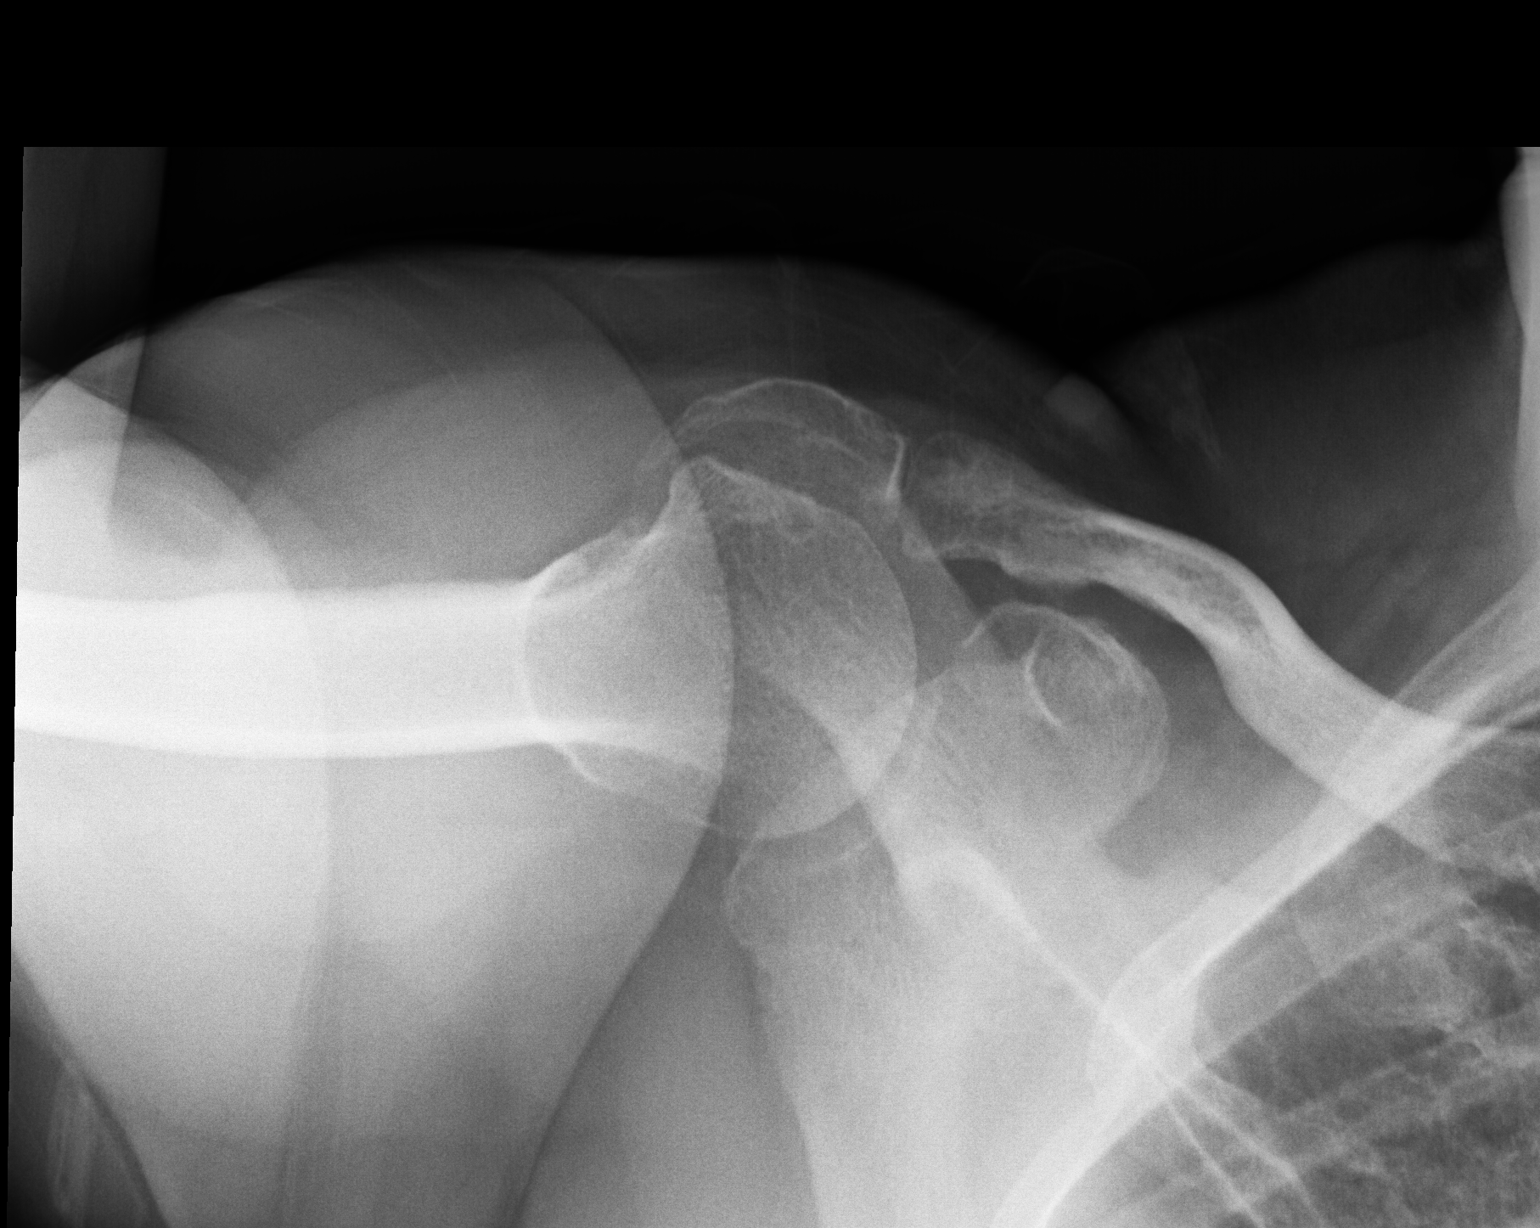

[3 of 3 positions shown; findings below may reference images not displayed]

FINDINGS: Glenohumeral joint appears congruent. No acute fracture line
identified. Unremarkable proximal humerus. Degenerative changes of
the acromioclavicular joint.
IMPRESSION: Negative for acute bony abnormality.

Degenerative changes of the AC joint

## 2018-03-06 DIAGNOSIS — G4733 Obstructive sleep apnea (adult) (pediatric): Secondary | ICD-10-CM | POA: Diagnosis not present

## 2018-04-18 ENCOUNTER — Encounter: Payer: Self-pay | Admitting: Endocrinology

## 2018-04-18 ENCOUNTER — Ambulatory Visit (INDEPENDENT_AMBULATORY_CARE_PROVIDER_SITE_OTHER): Payer: Medicare Other | Admitting: Endocrinology

## 2018-04-18 VITALS — BP 128/86 | HR 58 | Ht 64.5 in | Wt 201.6 lb

## 2018-04-18 DIAGNOSIS — M889 Osteitis deformans of unspecified bone: Secondary | ICD-10-CM | POA: Diagnosis not present

## 2018-04-18 LAB — HEPATIC FUNCTION PANEL
ALT: 13 U/L (ref 0–35)
AST: 20 U/L (ref 0–37)
Albumin: 3.9 g/dL (ref 3.5–5.2)
Alkaline Phosphatase: 143 U/L — ABNORMAL HIGH (ref 39–117)
Bilirubin, Direct: 0.1 mg/dL (ref 0.0–0.3)
Total Bilirubin: 0.7 mg/dL (ref 0.2–1.2)
Total Protein: 7.7 g/dL (ref 6.0–8.3)

## 2018-04-18 LAB — VITAMIN D 25 HYDROXY (VIT D DEFICIENCY, FRACTURES): VITD: 39.14 ng/mL (ref 30.00–100.00)

## 2018-04-18 NOTE — Patient Instructions (Addendum)
blood tests are requested for you today.  We'll let you know about the results. Please come back for a follow-up appointment in 1 year.   

## 2018-04-18 NOTE — Progress Notes (Signed)
Subjective:    Patient ID: Karen Black, female    DOB: Jun 08, 1946, 72 y.o.   MRN: [id number]  HPI Pt returns for f/u of Paget's Dz (dx'ed 2018, when AP was high; isoenzymes indicated bone cause, but Vit-D, PTH, and creat were normal; bone scan was abnormal only at the left shoulder;  plain films showed Paget's; hepatic US showed only NASH).  Pt says left shoulder is painless.  She recently had right shoulder pain, but x-rays did not show Paget's.   Past Medical History:  Diagnosis Date  . Diabetes ([place])   . Dyslipidemia   . Hypothyroidism   . Sleep apnea     No past surgical history on file.  Social History   Socioeconomic History  . Marital status: Divorced    Spouse name: Not on file  . Number of children: Not on file  . Years of education: Not on file  . Highest education level: Not on file  Occupational History  . Not on file  Social Needs  . Financial resource strain: Not on file  . Food insecurity:    Worry: Not on file    Inability: Not on file  . Transportation needs:    Medical: Not on file    Non-medical: Not on file  Tobacco Use  . Smoking status: Former Research scientist (life sciences)  . Smokeless tobacco: Never Used  Substance and Sexual Activity  . Alcohol use: No  . Drug use: Not on file  . Sexual activity: Not on file  Lifestyle  . Physical activity:    Days per week: Not on file    Minutes per session: Not on file  . Stress: Not on file  Relationships  . Social connections:    Talks on phone: Not on file    Gets together: Not on file    Attends religious service: Not on file    Active member of club or organization: Not on file    Attends meetings of clubs or organizations: Not on file    Relationship status: Not on file  . Intimate partner violence:    Fear of current or ex partner: Not on file    Emotionally abused: Not on file    Physically abused: Not on file    Forced sexual activity: Not on file  Other Topics Concern  . Not on file  Social History  Narrative  . Not on file    Current Outpatient Medications on File Prior to Visit  Medication Sig Dispense Refill  . amLODipine (NORVASC) 5 MG tablet Take 5 mg by mouth daily.    Marland Kitchen atorvastatin (LIPITOR) 10 MG tablet     . levothyroxine (SYNTHROID, LEVOTHROID) 75 MCG tablet     . metFORMIN (GLUCOPHAGE-XR) 500 MG 24 hr tablet Take 500 mg by mouth every morning.    . propranolol ER (INDERAL LA) 160 MG SR capsule     . quinapril (ACCUPRIL) 40 MG tablet 40 mg 2 (two) times daily.     Marland Kitchen triamterene-hydrochlorothiazide (DYAZIDE) 37.5-25 MG capsule Take 1 capsule by mouth daily.     No current facility-administered medications on file prior to visit.     No Known Allergies  Family History  Problem Relation Age of Onset  . Breast cancer Sister 40    BP 128/86   Pulse (!) 58   Ht 5' 4.5" (1.638 m)   Wt 201 lb 9.6 oz (91.4 kg)   SpO2 94%   BMI 34.07 kg/m    Review  of Systems Denies headache.     Objective:   Physical Exam VITAL SIGNS:  See vs page.  GENERAL: no distress.  Left shoulder: full ROM, without pain.       Assessment & Plan:  Paget's dz: recheck labs today Right shoulder pain: we discussed.  Not related to the above  Patient Instructions  blood tests are requested for you today.  We'll let you know about the results. Please come back for a follow-up appointment in 1 year.

## 2018-04-19 LAB — PTH, INTACT AND CALCIUM
Calcium: 9.8 mg/dL (ref 8.6–10.4)
PTH: 43 pg/mL (ref 14–64)

## 2018-07-31 ENCOUNTER — Other Ambulatory Visit: Payer: Self-pay | Admitting: Physician Assistant

## 2018-07-31 DIAGNOSIS — Z1231 Encounter for screening mammogram for malignant neoplasm of breast: Secondary | ICD-10-CM

## 2018-07-31 DIAGNOSIS — Z13820 Encounter for screening for osteoporosis: Secondary | ICD-10-CM

## 2018-10-28 ENCOUNTER — Ambulatory Visit: Payer: Medicare Other

## 2018-10-28 ENCOUNTER — Other Ambulatory Visit: Payer: Medicare Other

## 2018-12-25 ENCOUNTER — Ambulatory Visit
Admission: RE | Admit: 2018-12-25 | Discharge: 2018-12-25 | Disposition: A | Discharge status: Home / Self Care | Payer: Medicare Other | Source: Ambulatory Visit | Attending: Physician Assistant | Admitting: Physician Assistant

## 2018-12-25 ENCOUNTER — Other Ambulatory Visit: Payer: Self-pay

## 2018-12-25 DIAGNOSIS — Z1231 Encounter for screening mammogram for malignant neoplasm of breast: Secondary | ICD-10-CM

## 2018-12-25 DIAGNOSIS — Z13820 Encounter for screening for osteoporosis: Secondary | ICD-10-CM

## 2018-12-25 IMAGING — MG DIGITAL SCREENING BILATERAL MAMMOGRAM WITH TOMO AND CAD
6 of 10 series · 6 of 30 positions shown · non-contrast
Comparison: Previous exam(s).

CLINICAL DATA: Screening.

EXAM:
DIGITAL SCREENING BILATERAL MAMMOGRAM WITH TOMO AND CAD

[L CC synth-2D]
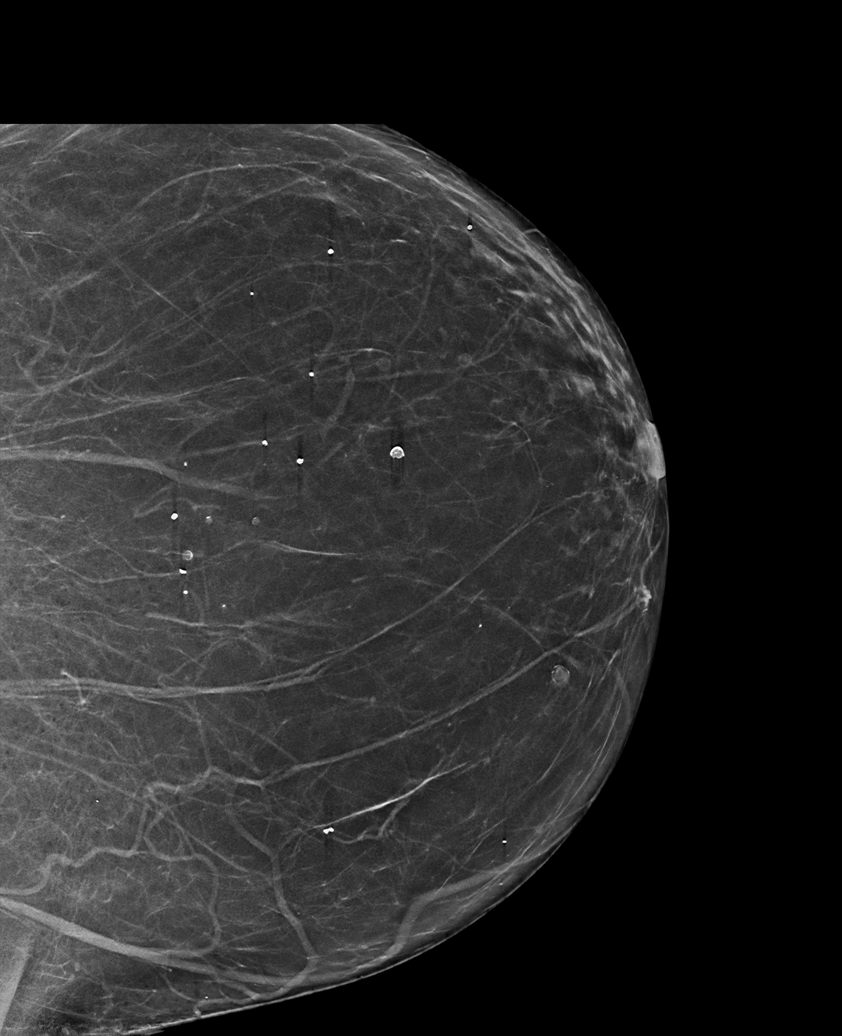

[L MLO synth-2D]
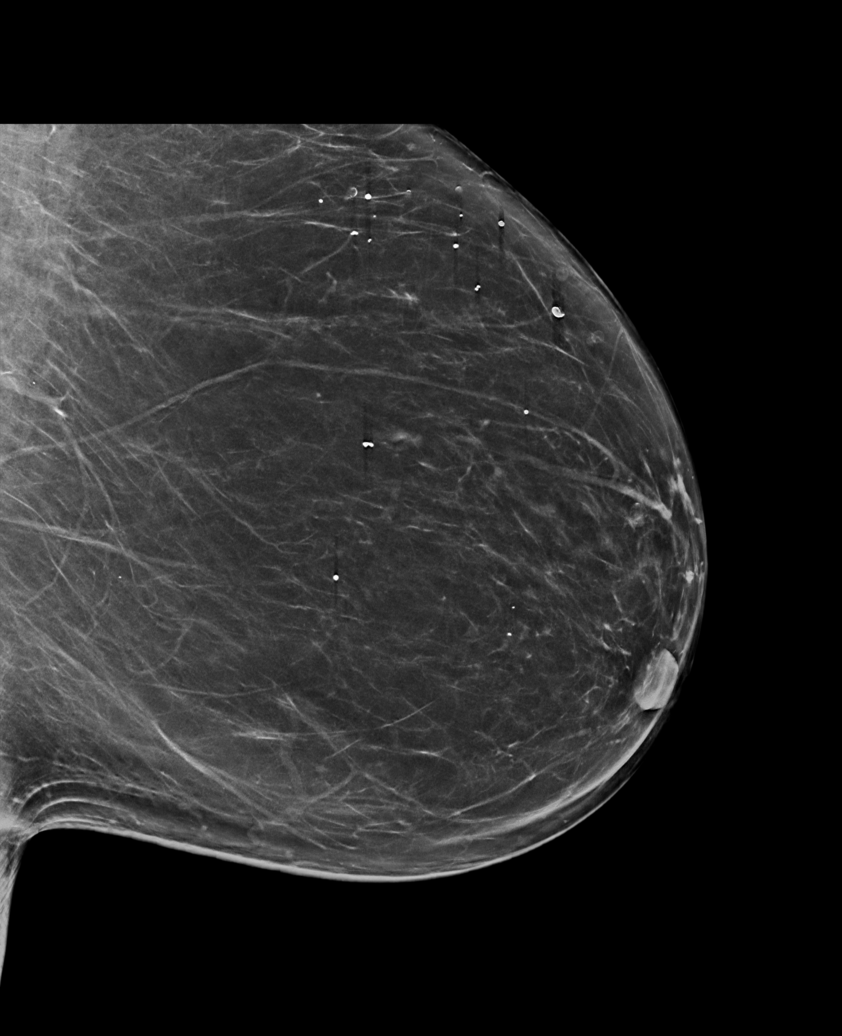

[R MLO synth-2D]
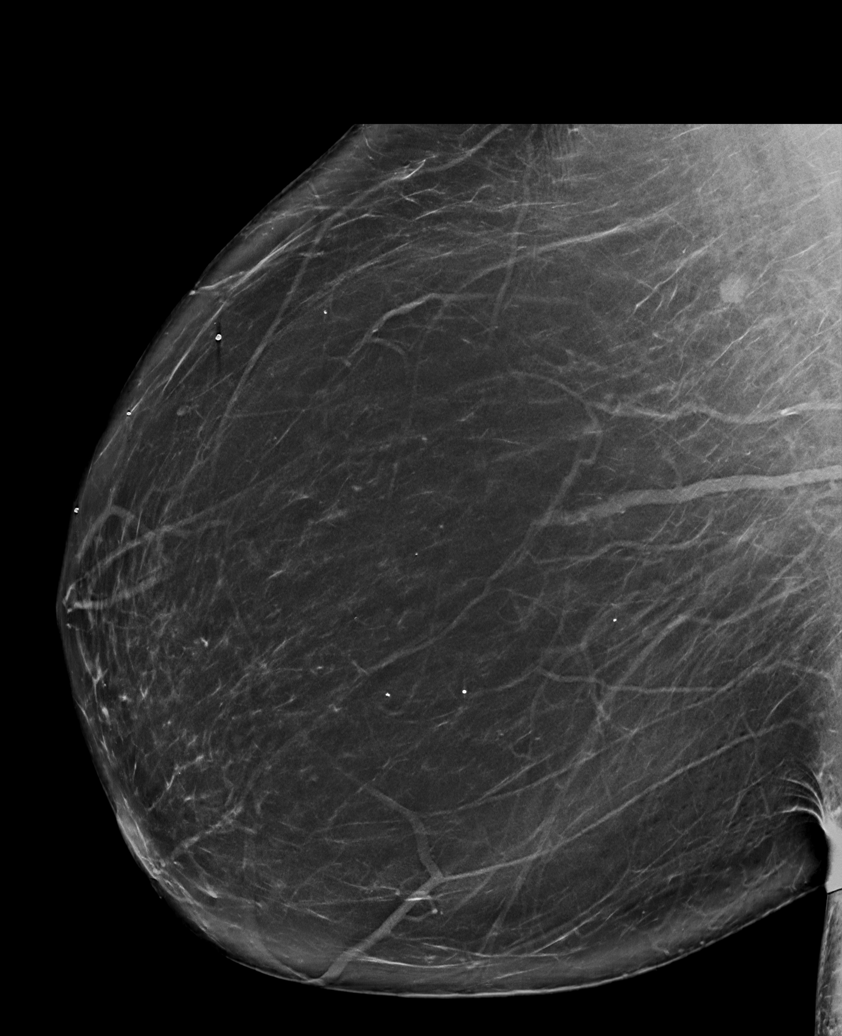

[R CC synth-2D]
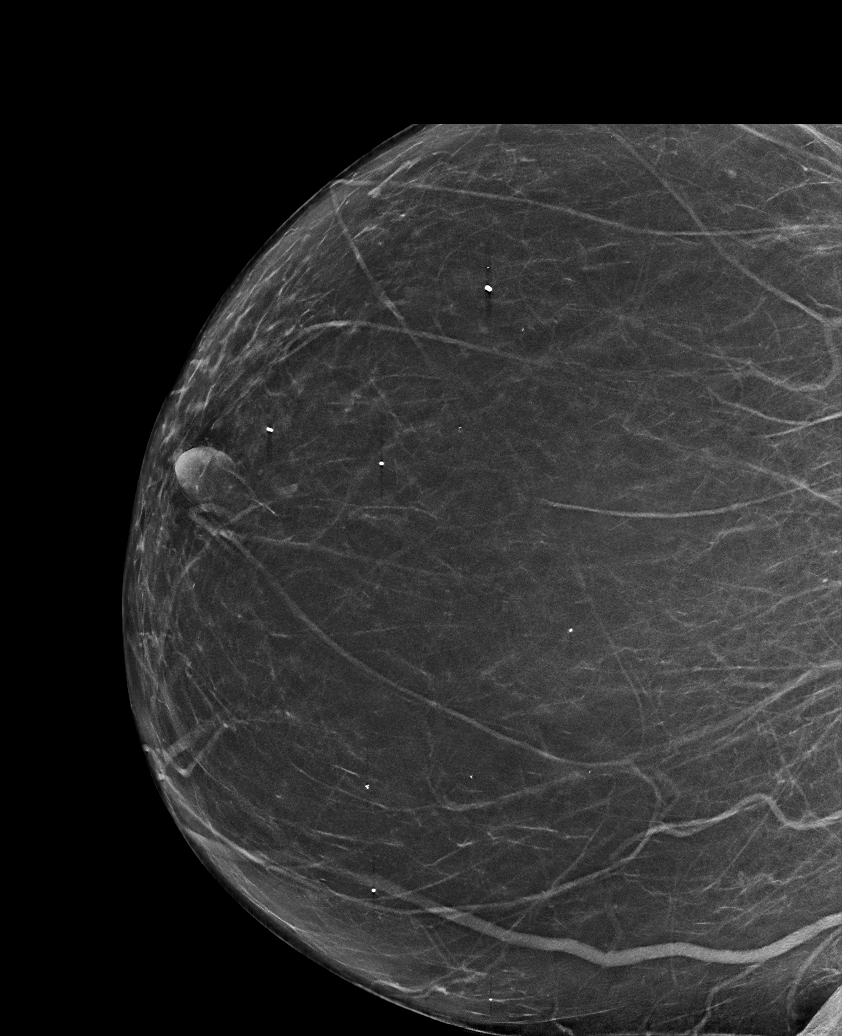

[R CV synth-2D]
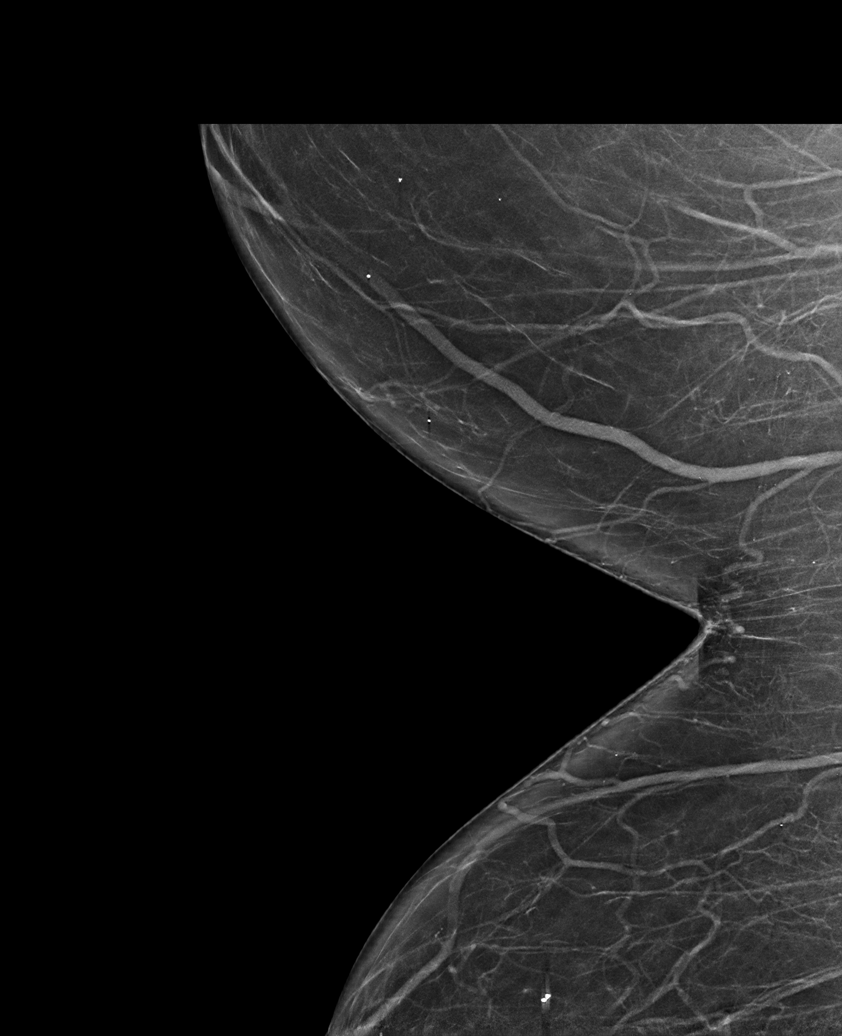

[R MLO tomo · tomo slice 44/87.0]
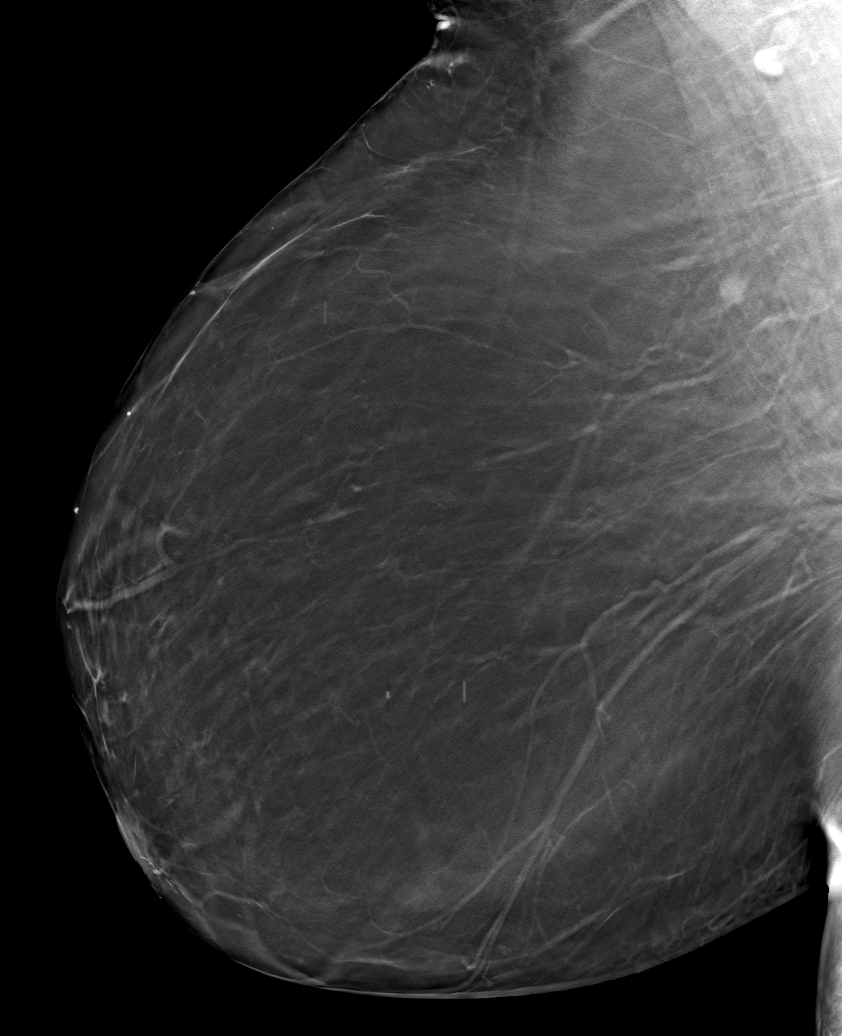

[6 of 30 positions shown; findings below may reference images not displayed]

ACR Breast Density Category b: There are scattered areas of
fibroglandular density.
FINDINGS: There are no findings suspicious for malignancy. Images were
processed with CAD.
IMPRESSION: No mammographic evidence of malignancy. A result letter of this
screening mammogram will be mailed directly to the patient.

RECOMMENDATION:
Screening mammogram in one year. (Code:[TQ])

BI-RADS CATEGORY  1: Negative.

## 2019-04-18 ENCOUNTER — Ambulatory Visit: Payer: Medicare Other | Admitting: Endocrinology

## 2019-04-18 DIAGNOSIS — Z0289 Encounter for other administrative examinations: Secondary | ICD-10-CM

## 2019-11-21 ENCOUNTER — Other Ambulatory Visit: Payer: Self-pay | Admitting: Physician Assistant

## 2019-11-21 DIAGNOSIS — Z1231 Encounter for screening mammogram for malignant neoplasm of breast: Secondary | ICD-10-CM

## 2019-12-26 ENCOUNTER — Ambulatory Visit
Admission: RE | Admit: 2019-12-26 | Discharge: 2019-12-26 | Disposition: A | Discharge status: Home / Self Care | Payer: Medicare Other | Source: Ambulatory Visit | Attending: Physician Assistant | Admitting: Physician Assistant

## 2019-12-26 ENCOUNTER — Other Ambulatory Visit: Payer: Self-pay

## 2019-12-26 DIAGNOSIS — Z1231 Encounter for screening mammogram for malignant neoplasm of breast: Secondary | ICD-10-CM

## 2019-12-26 IMAGING — MG DIGITAL SCREENING BILAT W/ TOMO W/ CAD
8 of 17 series · 8 of 40 positions shown · non-contrast
Comparison: Previous exam(s).

CLINICAL DATA: Screening.

EXAM:
DIGITAL SCREENING BILATERAL MAMMOGRAM WITH TOMO AND CAD

[R CC synth-2D (1 of 2)]
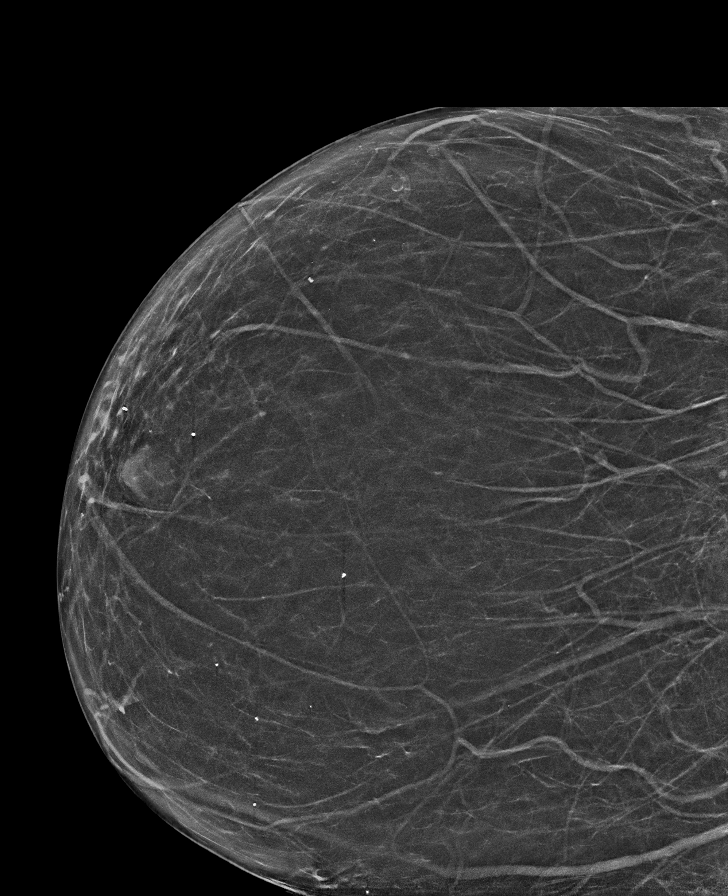

[L MLO synth-2D]
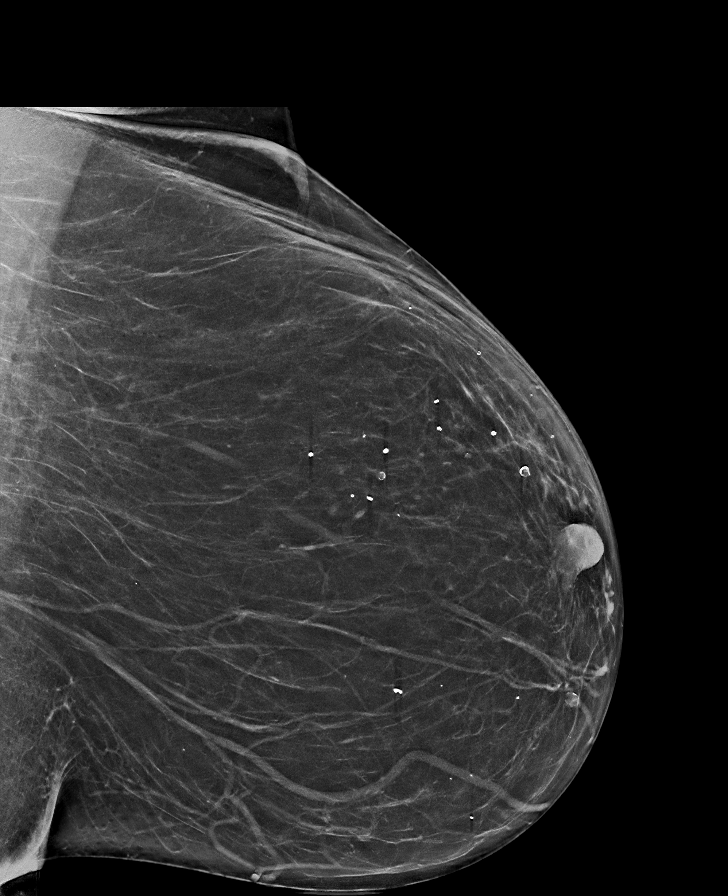

[R MLO synth-2D (1 of 2)]
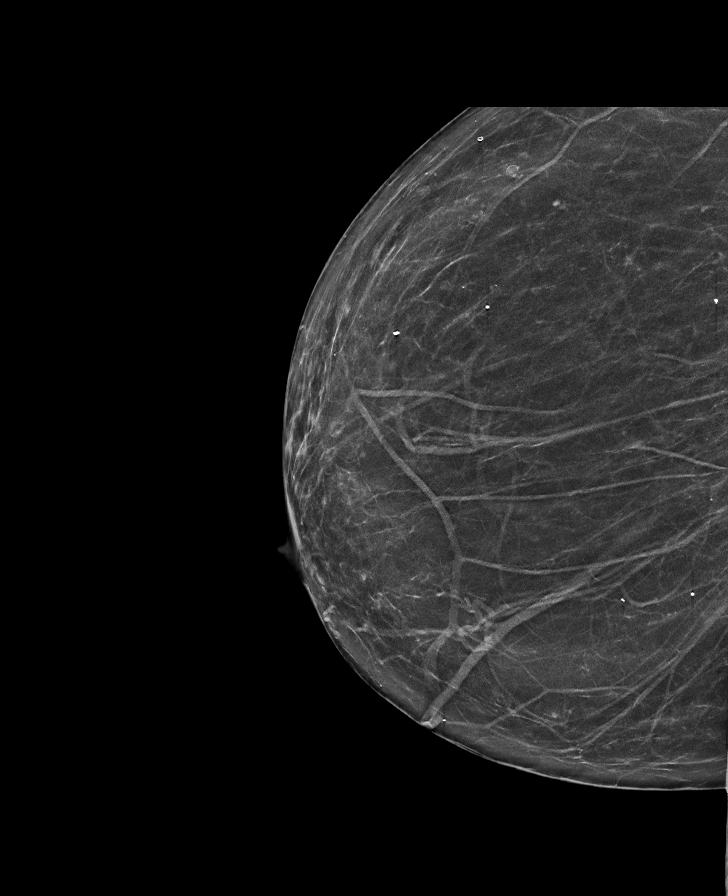

[L CC synth-2D]
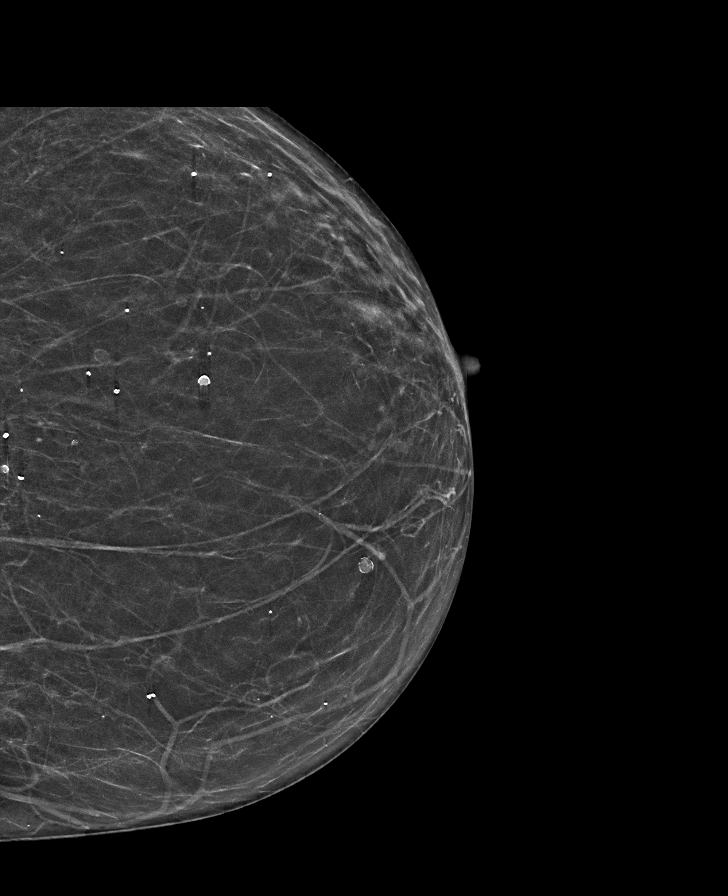

[R CC synth-2D (2 of 2)]
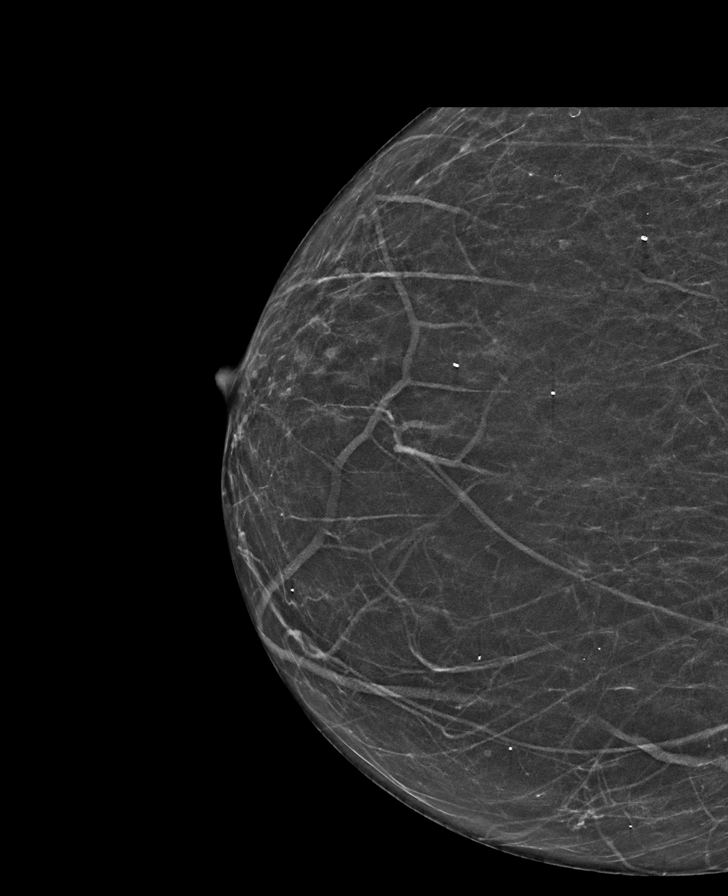

[L CV synth-2D]
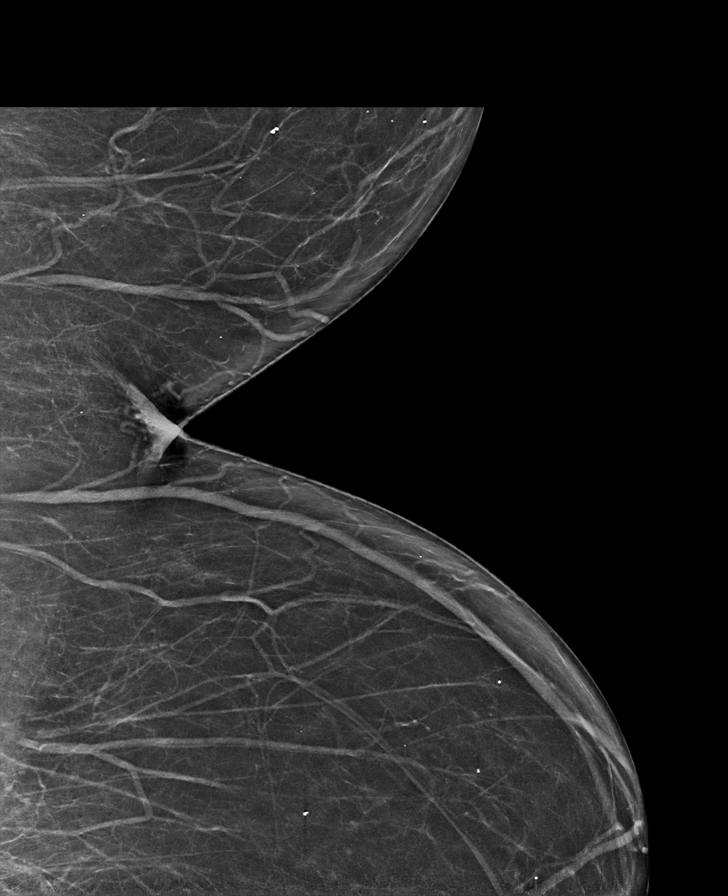

[R MLO synth-2D (2 of 2)]
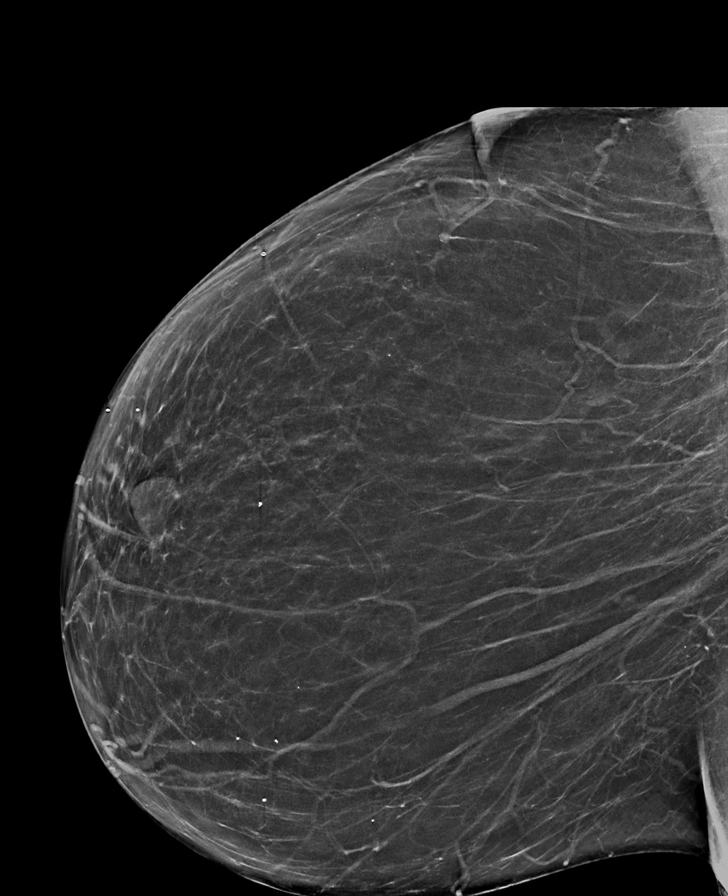

[L CV tomo · tomo slice 32/63.0]
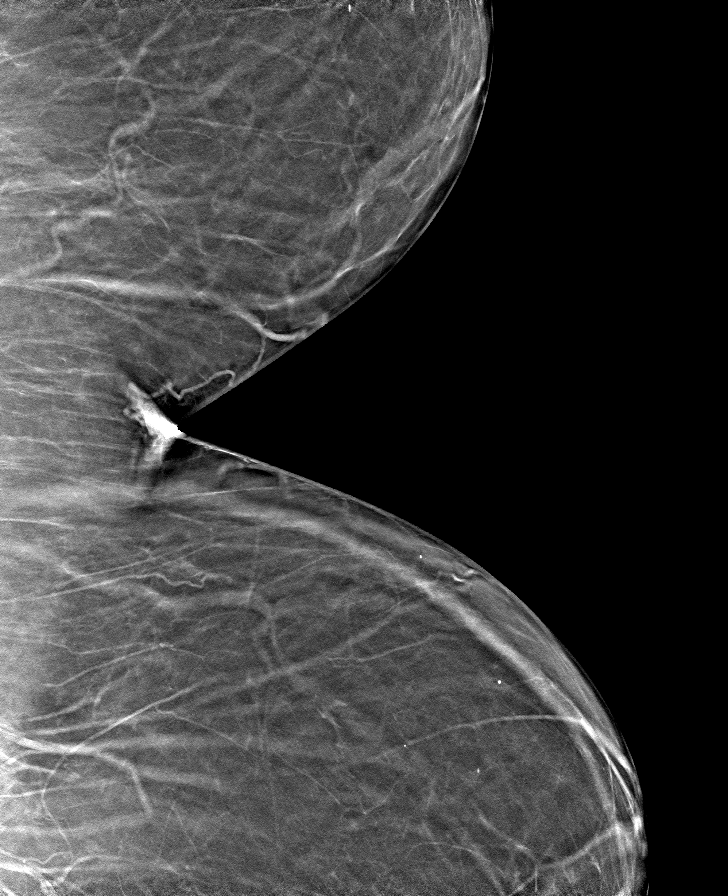

[8 of 40 positions shown; findings below may reference images not displayed]

ACR Breast Density Category b: There are scattered areas of
fibroglandular density.
FINDINGS: There are no findings suspicious for malignancy. Images were
processed with CAD.
IMPRESSION: No mammographic evidence of malignancy. A result letter of this
screening mammogram will be mailed directly to the patient.

RECOMMENDATION:
Screening mammogram in one year. (Code:[TQ])

BI-RADS CATEGORY  1: Negative.

## 2020-01-28 LAB — BASIC METABOLIC PANEL
BUN: 16 (ref 4–21)
Creatinine: 1.1 (ref 0.5–1.1)

## 2020-01-28 LAB — HEMOGLOBIN A1C: Hemoglobin A1C: 6.8

## 2020-01-28 LAB — TSH: TSH: 1.22 (ref 0.41–5.90)

## 2020-03-11 ENCOUNTER — Encounter: Payer: Self-pay | Admitting: Endocrinology

## 2020-03-11 ENCOUNTER — Other Ambulatory Visit: Payer: Self-pay

## 2020-03-11 ENCOUNTER — Ambulatory Visit
Admission: RE | Admit: 2020-03-11 | Discharge: 2020-03-11 | Disposition: A | Discharge status: Home / Self Care | Payer: Medicare Other | Source: Ambulatory Visit | Attending: Endocrinology | Admitting: Endocrinology

## 2020-03-11 ENCOUNTER — Telehealth: Payer: Self-pay

## 2020-03-11 ENCOUNTER — Ambulatory Visit (INDEPENDENT_AMBULATORY_CARE_PROVIDER_SITE_OTHER): Payer: Medicare Other | Admitting: Endocrinology

## 2020-03-11 DIAGNOSIS — M25562 Pain in left knee: Secondary | ICD-10-CM

## 2020-03-11 DIAGNOSIS — M25569 Pain in unspecified knee: Secondary | ICD-10-CM | POA: Insufficient documentation

## 2020-03-11 DIAGNOSIS — M889 Osteitis deformans of unspecified bone: Secondary | ICD-10-CM | POA: Diagnosis not present

## 2020-03-11 IMAGING — CR DG KNEE 1-2V*L*
2 series · 2 of 2 positions shown · non-contrast
Comparison: None.

CLINICAL DATA: Pain.  Paget's disease.  No injury.

EXAM:
LEFT KNEE - 1-2 VIEW

[t knee ap left]
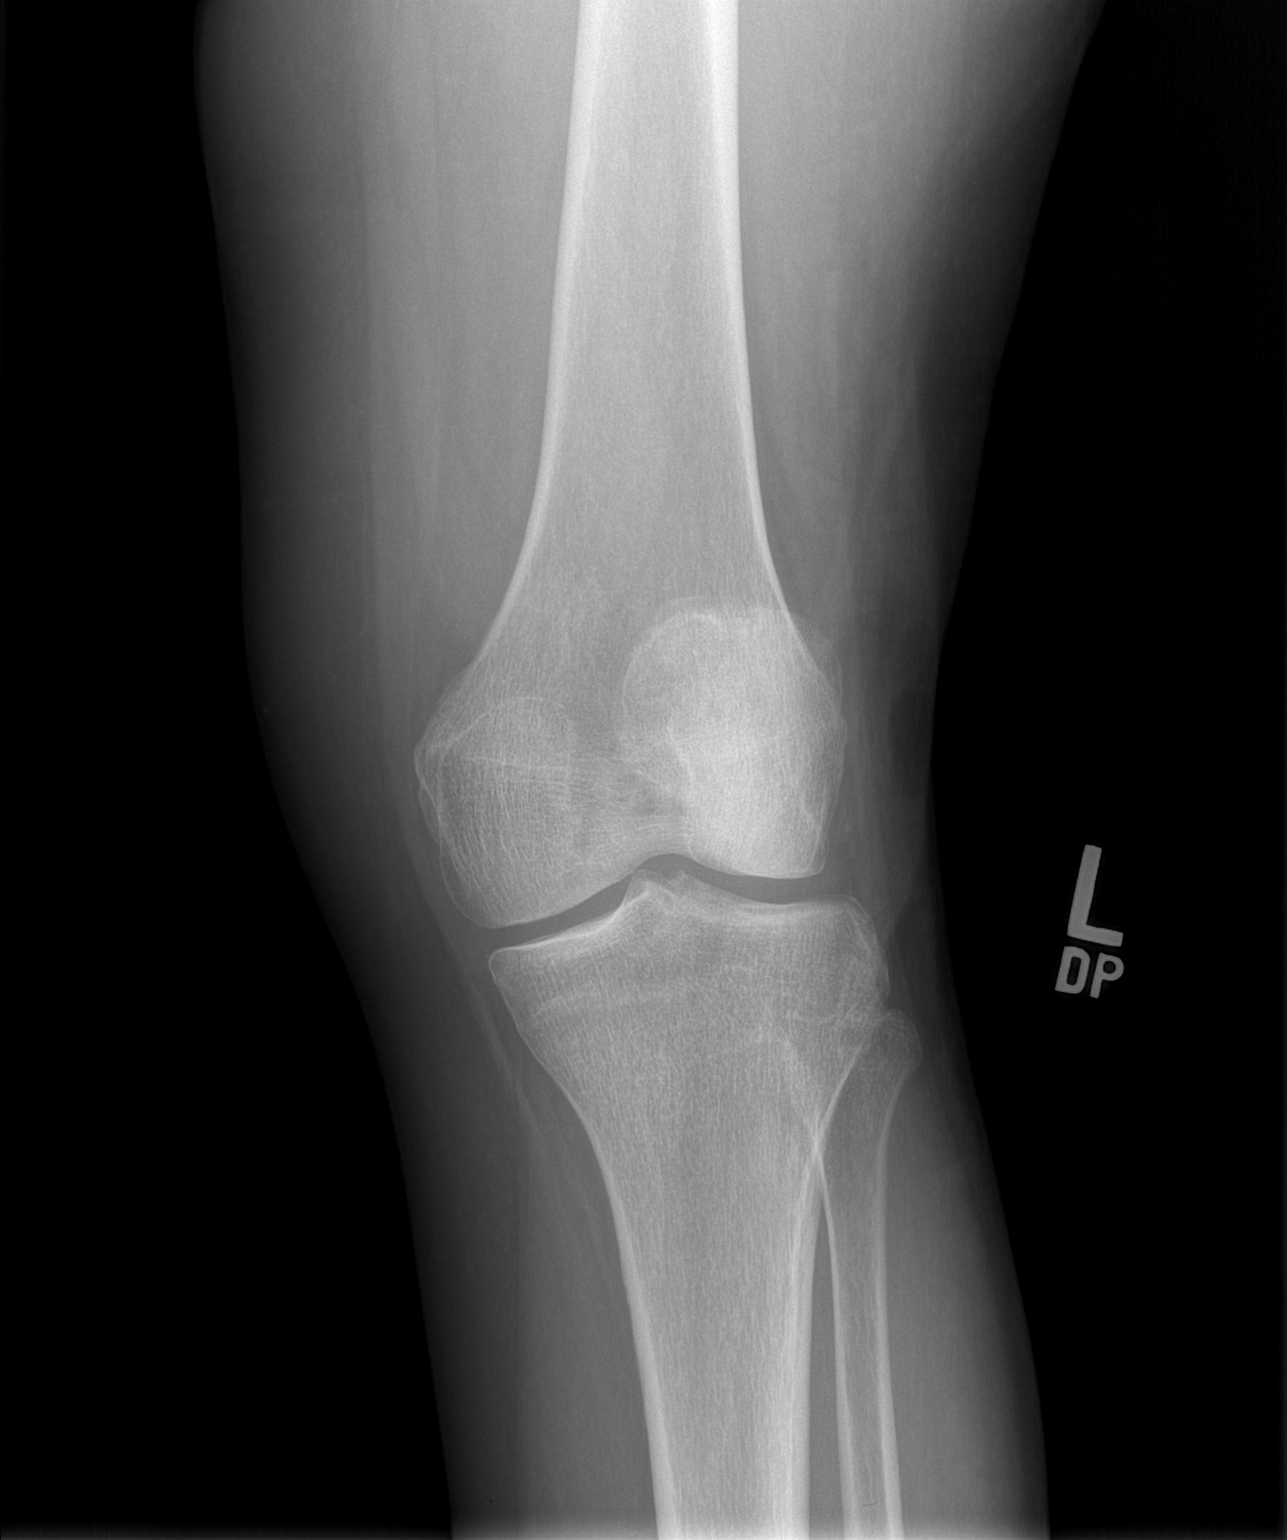

[t knee lat left]
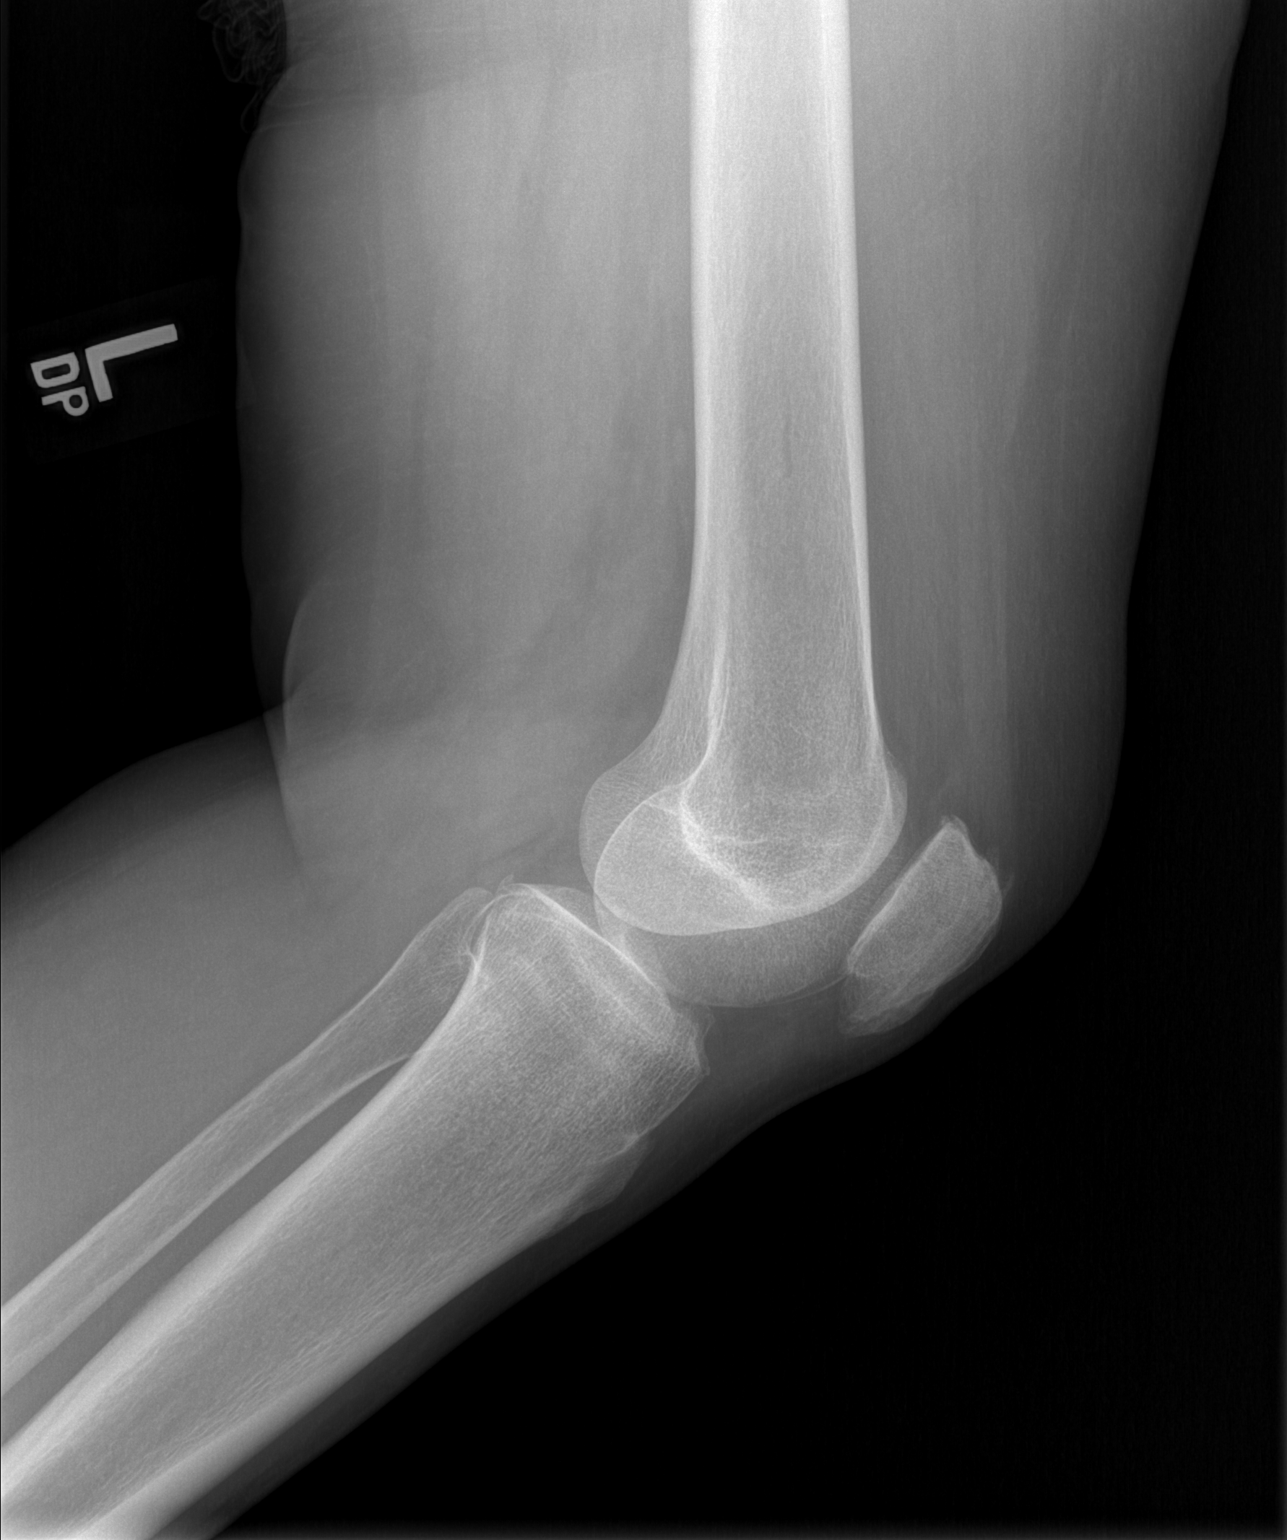

[2 of 2 positions shown; findings below may reference images not displayed]

FINDINGS: AP and lateral views. No acute fracture or dislocation. No joint
effusion. Joint spaces are maintained for age. Prominence of the
inferior pole of the patella is likely due to prominent
enthesophyte.
IMPRESSION: No acute osseous abnormality.

## 2020-03-11 NOTE — Patient Instructions (Addendum)
x-rays are requested for you today.  We'll let you know about the results.  Please come back for a follow-up appointment in 1 year.

## 2020-03-11 NOTE — Telephone Encounter (Signed)
-----   Message from Renato Shin, MD sent at 03/11/2020  9:30 AM EDT ----- please contact patient: Looks good

## 2020-03-11 NOTE — Telephone Encounter (Signed)
RESULTS  Results were reviewed by Dr. Loanne Drilling. Called pt to inform about results Using closed-loop communication, pt verbalized complete acceptance and understanding of all information provided. No further questions nor concerns were voiced at this time.

## 2020-03-11 NOTE — Progress Notes (Signed)
Subjective:    Patient ID: Karen Black, female    DOB: Jun 06, 1946, 74 y.o.   MRN: [id number]  HPI Pt returns for f/u of Paget's Dz (dx'ed 2018, when AP was high; she has never been on rx for this; isoenzymes indicated bone cause, but Vit-D, PTH, and creat were normal; bone scan was abnormal only at the left shoulder;  plain films showed Paget's; hepatic US showed only NASH; she also had right shoulder pain, but x-rays did not show Paget's).  She has left knee pain.    Past Medical History:  Diagnosis Date  . Diabetes ([place])   . Dyslipidemia   . Hypothyroidism   . Sleep apnea     No past surgical history on file.  Social History   Socioeconomic History  . Marital status: Divorced    Spouse name: Not on file  . Number of children: Not on file  . Years of education: Not on file  . Highest education level: Not on file  Occupational History  . Not on file  Tobacco Use  . Smoking status: Former Research scientist (life sciences)  . Smokeless tobacco: Never Used  Substance and Sexual Activity  . Alcohol use: No  . Drug use: Not on file  . Sexual activity: Not on file  Other Topics Concern  . Not on file  Social History Narrative  . Not on file   Social Determinants of Health   Financial Resource Strain:   . Difficulty of Paying Living Expenses: Not on file  Food Insecurity:   . Worried About Charity fundraiser in the Last Year: Not on file  . Ran Out of Food in the Last Year: Not on file  Transportation Needs:   . Lack of Transportation (Medical): Not on file  . Lack of Transportation (Non-Medical): Not on file  Physical Activity:   . Days of Exercise per Week: Not on file  . Minutes of Exercise per Session: Not on file  Stress:   . Feeling of Stress : Not on file  Social Connections:   . Frequency of Communication with Friends and Family: Not on file  . Frequency of Social Gatherings with Friends and Family: Not on file  . Attends Religious Services: Not on file  . Active Member of Clubs  or Organizations: Not on file  . Attends Archivist Meetings: Not on file  . Marital Status: Not on file  Intimate Partner Violence:   . Fear of Current or Ex-Partner: Not on file  . Emotionally Abused: Not on file  . Physically Abused: Not on file  . Sexually Abused: Not on file    Current Outpatient Medications on File Prior to Visit  Medication Sig Dispense Refill  . amLODipine (NORVASC) 5 MG tablet Take 5 mg by mouth daily.    Marland Kitchen atorvastatin (LIPITOR) 10 MG tablet     . levothyroxine (SYNTHROID, LEVOTHROID) 75 MCG tablet     . metFORMIN (GLUCOPHAGE-XR) 500 MG 24 hr tablet Take 500 mg by mouth every morning.    . propranolol ER (INDERAL LA) 160 MG SR capsule     . quinapril (ACCUPRIL) 40 MG tablet 40 mg 2 (two) times daily.     Marland Kitchen triamterene-hydrochlorothiazide (DYAZIDE) 37.5-25 MG capsule Take 1 capsule by mouth daily.     No current facility-administered medications on file prior to visit.    No Known Allergies  Family History  Problem Relation Age of Onset  . Breast cancer Sister 6  BP (!) 162/80   Pulse 86   Ht 5' 4.5" (1.638 m)   Wt 208 lb (94.3 kg)   SpO2 97%   BMI 35.15 kg/m   Review of Systems Denies left shoulder pain.     Objective:   Physical Exam VITAL SIGNS:  See vs page.   GENERAL: no distress.   Left knee: no swell/tend/warmth.        Assessment & Plan:  Paget's Dz, due for recheck Knee pain: uncertain etiology  Patient Instructions  x-rays are requested for you today.  We'll let you know about the results.  Please come back for a follow-up appointment in 1 year.

## 2020-07-30 ENCOUNTER — Other Ambulatory Visit: Payer: Self-pay | Admitting: Physician Assistant

## 2020-07-30 DIAGNOSIS — N183 Chronic kidney disease, stage 3 unspecified: Secondary | ICD-10-CM | POA: Diagnosis not present

## 2020-07-30 DIAGNOSIS — E890 Postprocedural hypothyroidism: Secondary | ICD-10-CM | POA: Diagnosis not present

## 2020-07-30 DIAGNOSIS — E1165 Type 2 diabetes mellitus with hyperglycemia: Secondary | ICD-10-CM | POA: Diagnosis not present

## 2020-07-30 DIAGNOSIS — Z87891 Personal history of nicotine dependence: Secondary | ICD-10-CM

## 2020-07-30 DIAGNOSIS — E1169 Type 2 diabetes mellitus with other specified complication: Secondary | ICD-10-CM | POA: Diagnosis not present

## 2020-07-30 DIAGNOSIS — Z7984 Long term (current) use of oral hypoglycemic drugs: Secondary | ICD-10-CM | POA: Diagnosis not present

## 2020-07-30 DIAGNOSIS — M889 Osteitis deformans of unspecified bone: Secondary | ICD-10-CM | POA: Diagnosis not present

## 2020-07-30 DIAGNOSIS — E785 Hyperlipidemia, unspecified: Secondary | ICD-10-CM | POA: Diagnosis not present

## 2020-07-30 DIAGNOSIS — Z23 Encounter for immunization: Secondary | ICD-10-CM | POA: Diagnosis not present

## 2020-07-30 DIAGNOSIS — Z0000 Encounter for general adult medical examination without abnormal findings: Secondary | ICD-10-CM | POA: Diagnosis not present

## 2020-07-30 DIAGNOSIS — I10 Essential (primary) hypertension: Secondary | ICD-10-CM | POA: Diagnosis not present

## 2020-07-30 DIAGNOSIS — R0600 Dyspnea, unspecified: Secondary | ICD-10-CM | POA: Diagnosis not present

## 2020-08-25 ENCOUNTER — Other Ambulatory Visit: Payer: Self-pay | Admitting: *Deleted

## 2020-08-25 DIAGNOSIS — Z87891 Personal history of nicotine dependence: Secondary | ICD-10-CM

## 2020-09-10 ENCOUNTER — Telehealth: Payer: Self-pay | Admitting: Acute Care

## 2020-09-10 NOTE — Telephone Encounter (Signed)
I called pt back and she stated that she had a missed call from our office today.  She said that they did not leave a VM.  I advised her that I did not see where we had called her today.  She stated that she is aware of appt with SG on 03/02.  Nothing further is needed.

## 2020-09-15 ENCOUNTER — Encounter: Payer: Self-pay | Admitting: Acute Care

## 2020-09-15 ENCOUNTER — Ambulatory Visit
Admission: RE | Admit: 2020-09-15 | Discharge: 2020-09-15 | Disposition: A | Discharge status: Home / Self Care | Payer: Medicare Other | Source: Ambulatory Visit | Attending: Acute Care | Admitting: Acute Care

## 2020-09-15 ENCOUNTER — Other Ambulatory Visit: Payer: Self-pay

## 2020-09-15 ENCOUNTER — Ambulatory Visit (INDEPENDENT_AMBULATORY_CARE_PROVIDER_SITE_OTHER): Payer: Medicare Other | Admitting: Acute Care

## 2020-09-15 VITALS — BP 160/80 | HR 75 | Temp 98.1°F | Ht 64.0 in | Wt 210.8 lb

## 2020-09-15 DIAGNOSIS — Z122 Encounter for screening for malignant neoplasm of respiratory organs: Secondary | ICD-10-CM

## 2020-09-15 DIAGNOSIS — N281 Cyst of kidney, acquired: Secondary | ICD-10-CM | POA: Diagnosis not present

## 2020-09-15 DIAGNOSIS — Z87891 Personal history of nicotine dependence: Secondary | ICD-10-CM

## 2020-09-15 DIAGNOSIS — K8020 Calculus of gallbladder without cholecystitis without obstruction: Secondary | ICD-10-CM | POA: Diagnosis not present

## 2020-09-15 DIAGNOSIS — J432 Centrilobular emphysema: Secondary | ICD-10-CM | POA: Diagnosis not present

## 2020-09-15 IMAGING — CT CT CHEST LUNG CANCER SCREENING LOW DOSE W/O CM
1 of 3 series · 9 of 40 positions shown, 12 images · non-contrast
Comparison: None.

CLINICAL DATA: Lung cancer screening. Former smoker. Forty-one
pack-year history

EXAM:
CT CHEST WITHOUT CONTRAST LOW-DOSE FOR LUNG CANCER SCREENING
TECHNIQUE: Multidetector CT imaging of the chest was performed following the
standard protocol without IV contrast.

[ct lung segmentation data · axial · 0.67mm/px · z∈[-316,-316]mm · 9 of 288 frames shown]
[frame 1/288  mediastinal]
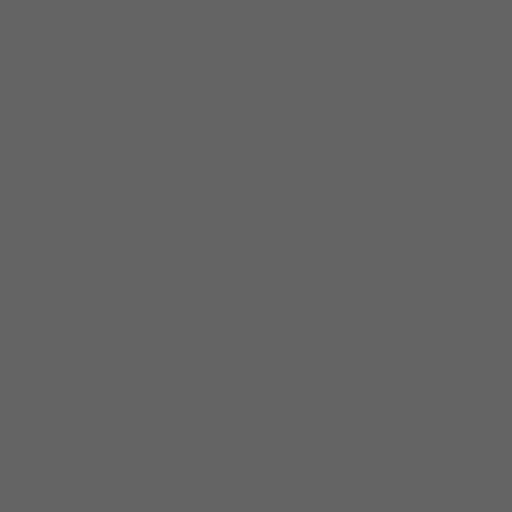
[frame 1/288  lung]
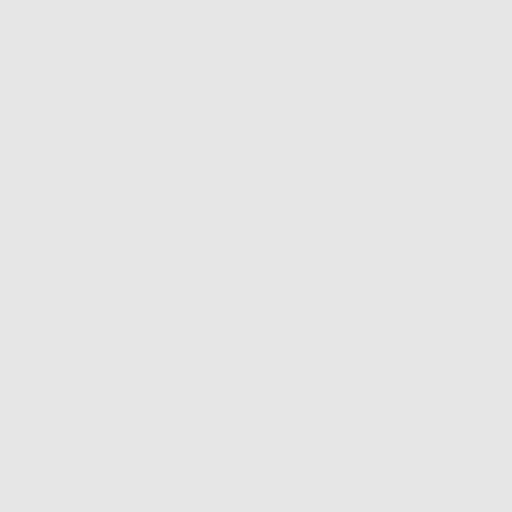
[frame 32/288  lung]
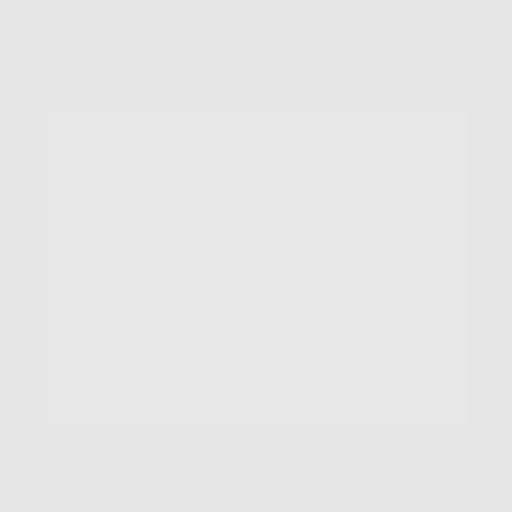
[frame 64/288  lung]
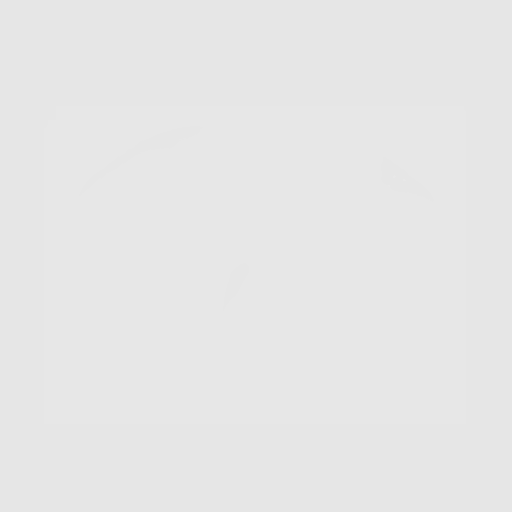
[frame 96/288  lung]
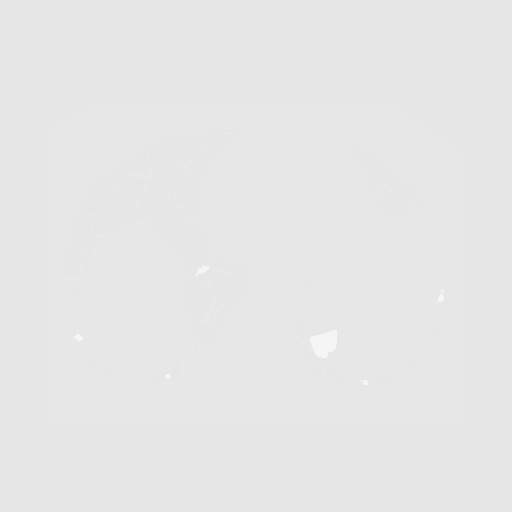
[frame 128/288  mediastinal]
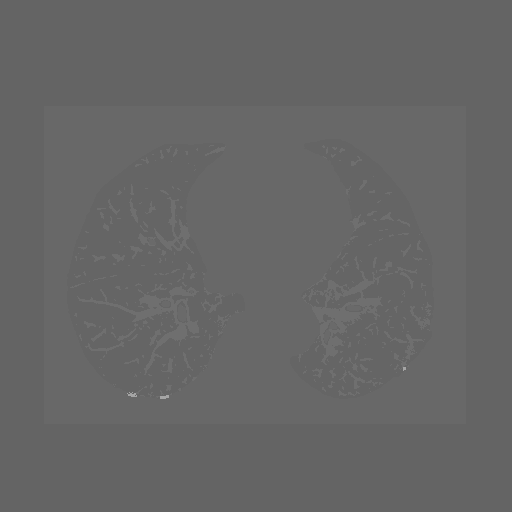
[frame 128/288  lung]
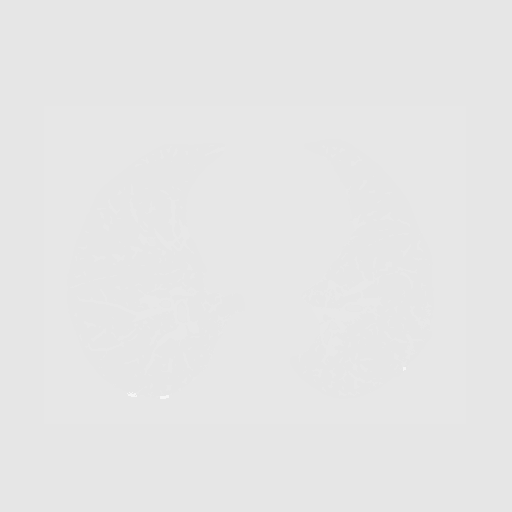
[frame 160/288  lung]
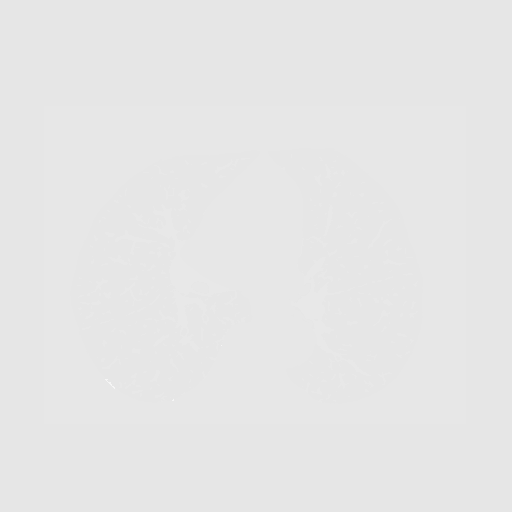
[frame 192/288  lung]
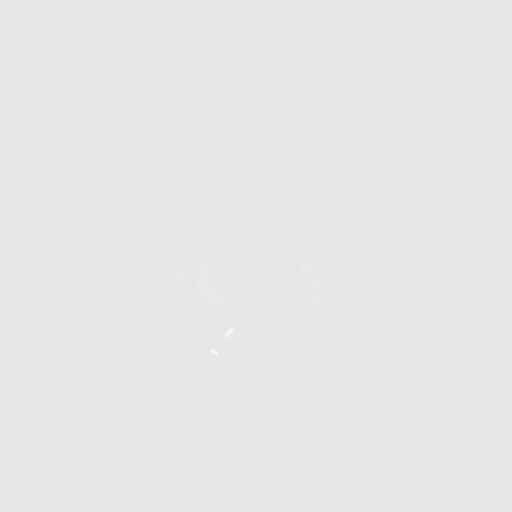
[frame 224/288  lung]
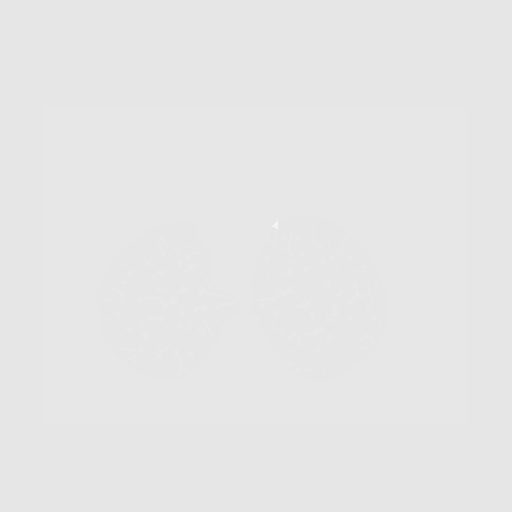
[frame 256/288  mediastinal]
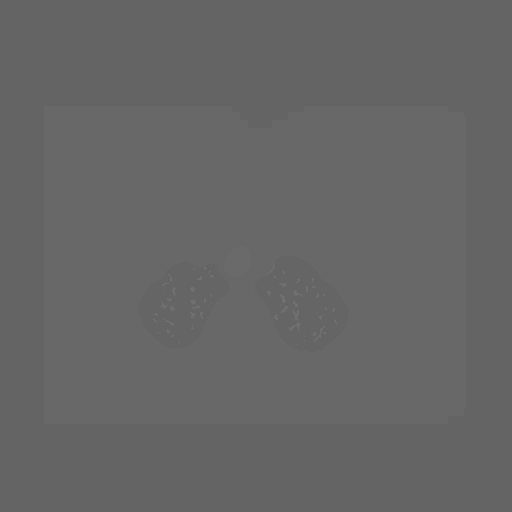
[frame 256/288  lung]
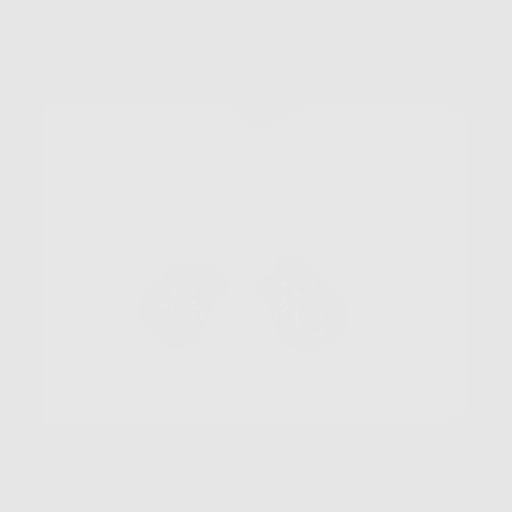

[9 of 40 positions shown; findings below may reference images not displayed]

FINDINGS: Cardiovascular: Heart size appears within normal limits. No
pericardial effusion. Aortic atherosclerosis.

Mediastinum/Nodes: Normal appearance of the thyroid gland. The
trachea appears patent and is midline. Normal appearance of the
esophagus. No enlarged lymph nodes.

Lungs/Pleura: Mild changes of centrilobular and paraseptal
emphysema. Scarring is noted within both lung bases. Several tiny
nodules are identified. The largest is in the superior segment of
left lower lobe with an equivalent diameter of 3.9 mm. No suspicious
lung nodules identified at this time.

Upper Abdomen: No acute abnormality. Gallstone identified measuring
8 mm. There is a conglomeration of multiple large complicated cyst
arising off the upper pole of the left kidney. This is incompletely
characterized without IV contrast material, image 54/2.

Musculoskeletal: No chest wall mass or suspicious bone lesions
identified.
IMPRESSION: 1. Lung-RADS 2s, benign appearance or behavior. Continue annual
screening with low-dose chest CT without contrast in 12 months.
2. The S modifier above refers to a potentially clinically
significant finding within the imaged portions of the upper abdomen.
Specifically, there is a conglomeration of simple and complex cysts
arising off the upper pole of the left kidney which exhibit varying
densities. This conglomeration is incompletely characterized without
IV contrast. Recommend further evaluation with nonemergent MRI of
the abdomen without and with contrast material.
3. Gallstone.

Aortic Atherosclerosis ([B2]-[B2]) and Emphysema ([B2]-[B2]).

These results will be called to the ordering clinician or
representative by the Radiologist Assistant, and communication
documented in the PACS or [REDACTED].

## 2020-09-15 NOTE — Patient Instructions (Addendum)
Thank you for participating in the [facility]. It was our pleasure to meet you today. We will call you with the results of your scan within the next few days. Your scan will be assigned a Lung RADS category score by the physicians reading the scans.  This Lung RADS score determines follow up scanning.  See below for description of categories, and follow up screening recommendations. We will be in touch to schedule your follow up screening annually or based on recommendations of our providers. We will fax a copy of your scan results to your Primary Care Physician, or the physician who referred you to the program, to ensure they have the results. Please call the office if you have any questions or concerns regarding your scanning experience or results.  Our office number is [phone]. Please speak with Denise Phelps, RN. She is our Lung Cancer Screening RN. If she is unavailable when you call, please have the office staff send her a message. She will return your call at her earliest convenience. Remember, if your scan is normal, we will scan you annually as long as you continue to meet the criteria for the program. (Age 55-77, Current smoker or smoker who has quit within the last 15 years). If you are a smoker, remember, quitting is the single most powerful action that you can take to decrease your risk of lung cancer and other pulmonary, breathing related problems. We know quitting is hard, and we are here to help.  Please let us know if there is anything we can do to help you meet your goal of quitting. If you are a former smoker, congratulations. We are proud of you! Remain smoke free! Remember you can refer friends or family members through the number above.  We will screen them to make sure they meet criteria for the program. Thank you for helping us take better care of you by participating in Lung Screening.  Lung RADS Categories:  Lung RADS 1: no nodules  or definitely non-concerning nodules.  Recommendation is for a repeat annual scan in 12 months.  Lung RADS 2:  nodules that are non-concerning in appearance and behavior with a very low likelihood of becoming an active cancer. Recommendation is for a repeat annual scan in 12 months.  Lung RADS 3: nodules that are probably non-concerning , includes nodules with a low likelihood of becoming an active cancer.  Recommendation is for a 6-month repeat screening scan. Often noted after an upper respiratory illness. We will be in touch to make sure you have no questions, and to schedule your 6-month scan.  Lung RADS 4 A: nodules with concerning findings, recommendation is most often for a follow up scan in 3 months or additional testing based on our provider's assessment of the scan. We will be in touch to make sure you have no questions and to schedule the recommended 3 month follow up scan.  Lung RADS 4 B:  indicates findings that are concerning. We will be in touch with you to schedule additional diagnostic testing based on our provider's  assessment of the scan.   

## 2020-09-15 NOTE — Progress Notes (Signed)
Shared Decision Making Visit Lung Cancer Screening Program [phone])   Eligibility:  Age 75 y.o.  Pack Years Smoking History Calculation 41 pack year smoking history (# packs/per year x # years smoked)  Recent History of coughing up blood  no  Unexplained weight loss? no ( >Than 15 pounds within the last 6 months )  Prior History Lung / other cancer no (Diagnosis within the last 5 years already requiring surveillance chest CT Scans).  Smoking Status Former Smoker  Former Smokers: Years since quit: 15 years  Quit Date: 2007  Visit Components:  Discussion included one or more decision making aids. yes  Discussion included risk/benefits of screening. yes  Discussion included potential follow up diagnostic testing for abnormal scans. yes  Discussion included meaning and risk of over diagnosis. yes  Discussion included meaning and risk of False Positives. yes  Discussion included meaning of total radiation exposure. yes  Counseling Included:  Importance of adherence to annual lung cancer LDCT screening. yes  Impact of comorbidities on ability to participate in the program. yes  Ability and willingness to under diagnostic treatment. yes  Smoking Cessation Counseling:  Current Smokers:   Discussed importance of smoking cessation. yes  Information about tobacco cessation classes and interventions provided to patient. yes  Patient provided with "ticket" for LDCT Scan. yes  Symptomatic Patient. no  CounselingNA  Diagnosis Code: Tobacco Use Z72.0  Asymptomatic Patient yes  Counseling (Intermediate counseling: > three minutes counseling) G1829  Former Smokers:   Discussed the importance of maintaining cigarette abstinence. yes  Diagnosis Code: Personal History of Nicotine Dependence. H37.169  Information about tobacco cessation classes and interventions provided to patient. Yes  Patient provided with "ticket" for LDCT Scan. yes  Written Order for Lung  Cancer Screening with LDCT placed in Epic. Yes (CT Chest Lung Cancer Screening Low Dose W/O CM) [id number] Z12.2-Screening of respiratory organs Z87.891-Personal history of nicotine dependence  BP (!) 160/80 (BP Location: Left Arm, Cuff Size: Large)   Pulse 75   Temp 98.1 F (36.7 C) (Temporal)   Ht 5\' 4"  (1.626 m)   Wt 210 lb 12.8 oz (95.6 kg)   SpO2 93%   BMI 36.18 kg/m    I spent 25 minutes of face to face time with Karen Black  discussing the risks and benefits of lung cancer screening. We viewed a power point together that explained in detail the above noted topics. We took the time to pause the power point at intervals to allow for questions to be asked and answered to ensure understanding. We discussed that she had taken the single most powerful action possible to decrease her risk of developing lung cancer when she quit smoking. I counseled her to remain smoke free, and to contact me if she ever had the desire to smoke again so that I can provide resources and tools to help support the effort to remain smoke free. We discussed the time and location of the scan, and that either  Doroteo Glassman RN or I will call with the results within  24-48 hours of receiving them. She has my card and contact information in the event she needs to speak with me, in addition to a copy of the power point we reviewed as a resource. She verbalized understanding of all of the above and had no further questions upon leaving the office.     I explained to the patient that there has been a high incidence of coronary artery disease noted on these exams. I  explained that this is a non-gated exam therefore degree or severity cannot be determined. This patient is currently on statin therapy. I have asked the patient to follow-up with their PCP regarding any incidental finding of coronary artery disease and management with diet or medication as they feel is clinically indicated. The patient verbalized understanding of the  above and had no further questions.   Pt.  has been referred for pulmonary consult for drop in saturations. She is not on inhalers and she has never had PFT's.  Karen Spatz, NP 09/15/2020 10:45 AM

## 2020-09-16 ENCOUNTER — Telehealth: Payer: Self-pay | Admitting: Acute Care

## 2020-09-16 DIAGNOSIS — Z87891 Personal history of nicotine dependence: Secondary | ICD-10-CM

## 2020-09-16 NOTE — Telephone Encounter (Signed)
Karen Black with [facility]   Call report:  IMPRESSION: 1. Lung-RADS 2s, benign appearance or behavior. Continue annual screening with low-dose chest CT without contrast in 12 months. 2. The S modifier above refers to a potentially clinically significant finding within the imaged portions of the upper abdomen. Specifically, there is a conglomeration of simple and complex cysts arising off the upper pole of the left kidney which exhibit varying densities. This conglomeration is incompletely characterized without IV contrast. Recommend further evaluation with nonemergent MRI of the abdomen without and with contrast material. 3. Gallstone.  Aortic Atherosclerosis (ICD10-I70.0) and Emphysema (ICD10-J43.9).  Will route to SG/Denise

## 2020-09-17 NOTE — Telephone Encounter (Signed)
Pt returning missed call. 

## 2020-09-17 NOTE — Telephone Encounter (Signed)
Karen Black, I have tried calling the patient. There was no answer. I have left a HIPPA compliant message on the VM of the only number listed in the chart. I have asked her to call the office and ask for either you or me for her results.  She will need a 12 month follow up Low Dose Ct.  She will need her PCP to order non- emergent MRI of the abdomen with and without contrast to evaluate conglomeration of simple and complex cysts arising off the upper pole of the left kidney which exhibit varying densities.  Thanks so much.

## 2020-09-20 NOTE — Telephone Encounter (Signed)
Pt informed of CT results per Eric Form, NP.  PT verbalized understanding.  Copy sent to PCP.  Order placed for 1 yr f/u CT. I advised pt that she should call PCP office if she doesn't hear from them to get MRI of abdomen scheduled. Pt verbalized understanding.

## 2020-09-21 NOTE — Progress Notes (Signed)
Pt has been called with the results by Doroteo Glassman RN. See telephone note dated 09/20/2020. I also told the patient to follow up with her PCP if she does not get a call regarding scheduling of the non-emergent MRI. She verbalized understanding.

## 2020-09-27 ENCOUNTER — Other Ambulatory Visit: Payer: Self-pay | Admitting: Physician Assistant

## 2020-09-27 DIAGNOSIS — N281 Cyst of kidney, acquired: Secondary | ICD-10-CM

## 2020-10-05 ENCOUNTER — Ambulatory Visit: Payer: Medicare Other | Admitting: Pulmonary Disease

## 2020-10-05 ENCOUNTER — Other Ambulatory Visit: Payer: Self-pay

## 2020-10-05 ENCOUNTER — Encounter: Payer: Self-pay | Admitting: Pulmonary Disease

## 2020-10-05 VITALS — BP 140/84 | HR 70 | Temp 97.3°F | Ht 64.0 in | Wt 209.2 lb

## 2020-10-05 DIAGNOSIS — R0602 Shortness of breath: Secondary | ICD-10-CM

## 2020-10-05 DIAGNOSIS — R0902 Hypoxemia: Secondary | ICD-10-CM

## 2020-10-05 MED ORDER — ALBUTEROL SULFATE HFA 108 (90 BASE) MCG/ACT IN AERS: 2.0000 | INHALATION_SPRAY | Freq: Four times a day (QID) | RESPIRATORY_TRACT | 6 refills | Status: DC | PRN

## 2020-10-05 NOTE — Progress Notes (Signed)
Synopsis: Referred in March 2022 by Eric Form, NP for low oxygen saturations  Subjective:   PATIENT ID: Karen Black GENDER: female DOB: [birth year], MRN: [id number]   HPI  Chief Complaint  Patient presents with  . Consult    Referred by SG from lung cancer screening program for low oxygen levels. States she has SOB with activity and rushing. Denies using O2.     Karen Black is a 75 year old woman, former smoker with diabetes and sleep apnea who is referred to pulmonary clinic for low oxygen saturations.   She reports dyspnea on exertion for the past 6 months. She has noticed it with extended walks and when she is exercising 3 times per week at the [facility]. She denies any associated wheezing or cough with the dyspnea. She denies chest pain with the dyspnea.  She has history of obstructive sleep apnea and is using CPAP. She is followed by Carlos Levering, PA at [facility].   On simple walk in our office today she had SpO2 saturations to 77% upon the first lap of walking with rapid return to 95% SpO2 with rest.  Past Medical History:  Diagnosis Date  . Diabetes ([place])   . Dyslipidemia   . Hypothyroidism   . Sleep apnea      Family History  Problem Relation Age of Onset  . Breast cancer Sister 68     Social History   Socioeconomic History  . Marital status: Divorced    Spouse name: Not on file  . Number of children: Not on file  . Years of education: Not on file  . Highest education level: Not on file  Occupational History  . Not on file  Tobacco Use  . Smoking status: Former Research scientist (life sciences)  . Smokeless tobacco: Never Used  Substance and Sexual Activity  . Alcohol use: No  . Drug use: Not on file  . Sexual activity: Not on file  Other Topics Concern  . Not on file  Social History Narrative  . Not on file   Social Determinants of Health   Financial Resource Strain: Not on file  Food Insecurity: Not on file  Transportation Needs: Not on file  Physical  Activity: Not on file  Stress: Not on file  Social Connections: Not on file  Intimate Partner Violence: Not on file     No Known Allergies   Outpatient Medications Prior to Visit  Medication Sig Dispense Refill  . amLODipine (NORVASC) 5 MG tablet Take 5 mg by mouth daily.    Marland Kitchen atorvastatin (LIPITOR) 10 MG tablet     . levothyroxine (SYNTHROID, LEVOTHROID) 75 MCG tablet     . metFORMIN (GLUCOPHAGE) 500 MG tablet Take 500 mg by mouth 2 (two) times daily.    . propranolol ER (INDERAL LA) 160 MG SR capsule     . quinapril (ACCUPRIL) 40 MG tablet 40 mg 2 (two) times daily.     Marland Kitchen triamterene-hydrochlorothiazide (DYAZIDE) 37.5-25 MG capsule Take 1 capsule by mouth daily.    . metFORMIN (GLUCOPHAGE-XR) 500 MG 24 hr tablet Take 500 mg by mouth every morning.     No facility-administered medications prior to visit.    Review of Systems  Constitutional: Negative for chills, fever, malaise/fatigue and weight loss.  HENT: Negative for congestion, sinus pain and sore throat.   Eyes: Negative.   Respiratory: Positive for shortness of breath. Negative for cough, hemoptysis, sputum production and wheezing.   Cardiovascular: Negative for chest pain, palpitations, orthopnea, claudication  and leg swelling.  Gastrointestinal: Negative for abdominal pain, heartburn, nausea and vomiting.  Genitourinary: Negative.   Musculoskeletal: Negative for joint pain and myalgias.  Skin: Negative for rash.  Neurological: Negative for weakness.  Endo/Heme/Allergies: Negative.   Psychiatric/Behavioral: Negative.       Objective:   Vitals:   10/05/20 0937  BP: 140/84  Pulse: 70  Temp: (!) 97.3 F (36.3 C)  TempSrc: Temporal  SpO2: 91%  Weight: 209 lb 3.2 oz (94.9 kg)  Height: 5\' 4"  (1.626 m)     Physical Exam Constitutional:      General: She is not in acute distress.    Appearance: She is obese. She is not ill-appearing.  HENT:     Head: Normocephalic and atraumatic.  Eyes:     General: No  scleral icterus.    Conjunctiva/sclera: Conjunctivae normal.     Pupils: Pupils are equal, round, and reactive to light.  Cardiovascular:     Rate and Rhythm: Normal rate and regular rhythm.     Pulses: Normal pulses.     Heart sounds: Normal heart sounds. No murmur heard.   Pulmonary:     Effort: Pulmonary effort is normal.     Breath sounds: Normal breath sounds. No wheezing, rhonchi or rales.  Abdominal:     General: Bowel sounds are normal.     Palpations: Abdomen is soft.  Musculoskeletal:     Right lower leg: No edema.     Left lower leg: No edema.  Lymphadenopathy:     Cervical: No cervical adenopathy.  Skin:    General: Skin is warm and dry.  Neurological:     General: No focal deficit present.     Mental Status: She is alert.  Psychiatric:        Mood and Affect: Mood normal.        Behavior: Behavior normal.        Thought Content: Thought content normal.        Judgment: Judgment normal.    CBC No results found for: WBC, RBC, HGB, HCT, PLT, MCV, MCH, MCHC, RDW, LYMPHSABS, MONOABS, EOSABS, BASOSABS   Chest imaging: CT Chest Lung Cancer Screening 09/15/20 1. Lung-RADS 2s, benign appearance or behavior. Continue annual screening with low-dose chest CT without contrast in 12 months.  Mediastinum/Nodes: Normal appearance of the thyroid gland. The trachea appears patent and is midline. Normal appearance of the esophagus. No enlarged lymph nodes.  Lungs/Pleura: Mild changes of centrilobular and paraseptal emphysema. Scarring is noted within both lung bases. Several tiny nodules are identified. The largest is in the superior segment of left lower lobe with an equivalent diameter of 3.9 mm. No suspicious lung nodules identified at this time.  There is a conglomeration of multiple large complicated cyst arising off the upper pole of the left kidney. This is incompletely characterized without IV contrast material  PFT: No flowsheet data found.     Assessment  & Plan:   Shortness of breath - Plan: Pulmonary Function Test, albuterol (VENTOLIN HFA) 108 (90 Base) MCG/ACT inhaler  Exercise hypoxemia - Plan: ECHOCARDIOGRAM COMPLETE BUBBLE STUDY  Discussion: Karen Black is a 75 year old woman, former smoker with diabetes and sleep apnea who is referred to pulmonary clinic for low oxygen saturations and exertional dyspnea.  She does have decline in her SpO2 with exertion as noted on simple walk in clinic today where she dropped to 77% SpO2 upon the first lap of walking. She has declined supplemental oxygen at this  time. We will check an ECHO with bubble study for further evaluation and she is to return in 1 month for pulmonary function testing.   Etiology of her respiratory failure include pulmonary hypertension, interstitial lung disease, or shunt physiology.   She will likely need to have an in lab sleep study done. I will contact Carlos Levering at [facility] who has followed her for sleep apnea.  Follow up in 1 month.  Freda Jackson, MD [facility] Office: [phone]   Current Outpatient Medications:  .  albuterol (VENTOLIN HFA) 108 (90 Base) MCG/ACT inhaler, Inhale 2 puffs into the lungs every 6 (six) hours as needed for wheezing or shortness of breath., Disp: 8 g, Rfl: 6 .  amLODipine (NORVASC) 5 MG tablet, Take 5 mg by mouth daily., Disp: , Rfl:  .  atorvastatin (LIPITOR) 10 MG tablet, , Disp: , Rfl:  .  levothyroxine (SYNTHROID, LEVOTHROID) 75 MCG tablet, , Disp: , Rfl:  .  metFORMIN (GLUCOPHAGE) 500 MG tablet, Take 500 mg by mouth 2 (two) times daily., Disp: , Rfl:  .  propranolol ER (INDERAL LA) 160 MG SR capsule, , Disp: , Rfl:  .  quinapril (ACCUPRIL) 40 MG tablet, 40 mg 2 (two) times daily. , Disp: , Rfl:  .  triamterene-hydrochlorothiazide (DYAZIDE) 37.5-25 MG capsule, Take 1 capsule by mouth daily., Disp: , Rfl:

## 2020-10-05 NOTE — Patient Instructions (Signed)
Use albuterol 1-2 puffs as needed for shortness of breath every 4-6 hours - use 15-30 minutes before exercise  Follow up in 1 month with pulmonary function testing

## 2020-10-07 DIAGNOSIS — R0600 Dyspnea, unspecified: Secondary | ICD-10-CM | POA: Diagnosis not present

## 2020-10-07 DIAGNOSIS — G4733 Obstructive sleep apnea (adult) (pediatric): Secondary | ICD-10-CM | POA: Diagnosis not present

## 2020-10-07 DIAGNOSIS — J439 Emphysema, unspecified: Secondary | ICD-10-CM | POA: Diagnosis not present

## 2020-10-07 DIAGNOSIS — R0902 Hypoxemia: Secondary | ICD-10-CM | POA: Diagnosis not present

## 2020-10-17 ENCOUNTER — Other Ambulatory Visit: Payer: Self-pay

## 2020-10-17 ENCOUNTER — Ambulatory Visit
Admission: RE | Admit: 2020-10-17 | Discharge: 2020-10-17 | Disposition: A | Discharge status: Home / Self Care | Payer: Medicare Other | Source: Ambulatory Visit | Attending: Physician Assistant | Admitting: Physician Assistant

## 2020-10-17 DIAGNOSIS — N281 Cyst of kidney, acquired: Secondary | ICD-10-CM | POA: Diagnosis not present

## 2020-10-17 DIAGNOSIS — C649 Malignant neoplasm of unspecified kidney, except renal pelvis: Secondary | ICD-10-CM | POA: Diagnosis not present

## 2020-10-17 DIAGNOSIS — K8020 Calculus of gallbladder without cholecystitis without obstruction: Secondary | ICD-10-CM | POA: Diagnosis not present

## 2020-10-17 DIAGNOSIS — Q453 Other congenital malformations of pancreas and pancreatic duct: Secondary | ICD-10-CM | POA: Diagnosis not present

## 2020-10-17 IMAGING — MR MR ABDOMEN WO/W CM
11 of 17 series · 26 of 48 positions shown · IV contrast (multihance)
Comparison: Lung cancer screening CT [DATE].

CLINICAL DATA: Renal cysts on recent noncontrast chest CT.

EXAM:
MRI ABDOMEN WITHOUT AND WITH CONTRAST
TECHNIQUE: Multiplanar multisequence MR imaging of the abdomen was performed
both before and after the administration of intravenous contrast.
CONTRAST:  20mL MULTIHANCE GADOBENATE DIMEGLUMINE 529 MG/ML IV SOLN

[Series 3: T2 · axial · 6.5mm · 0.78mm/px · 1 of 38 slices shown (1 of 3)]
[im 1/38]
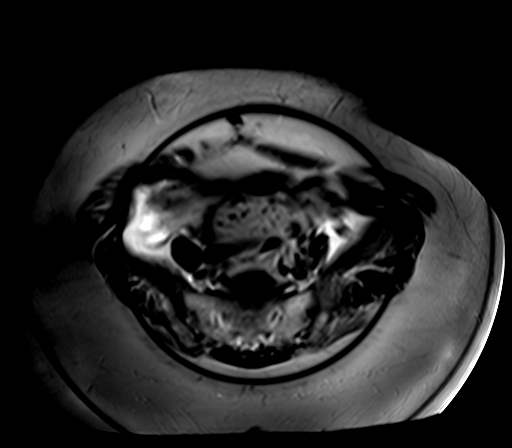

[Series 4: T2 · coronal · 5.0mm · 1.56mm/px · 1 of 35 slices shown (2 of 3)]
[im 1/35]
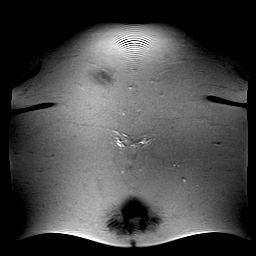

[Series 5: axial tru fisp · axial · 5.0mm · 1.56mm/px · 1 of 50 slices shown]
[im 1/50]
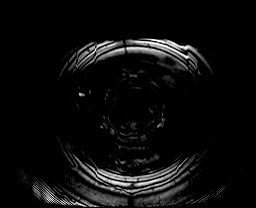

[Series 6: ep2d_diff_b50_500_800_p2 · axial · 6.0mm · 2.08mm/px · z∈[-86,+203]mm · 3 of 114 slices shown]
[im 1/114]
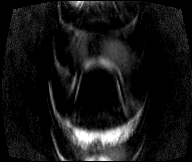
[im 57/114]
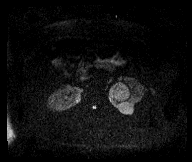
[im 114/114]
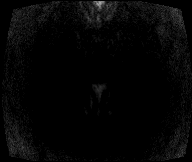

[Series 7: ep2d_diff_b50_500_800_p2_adc · axial · 6.0mm · 2.08mm/px · 1 of 38 slices shown]
[im 1/38]
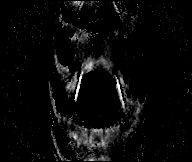

[Series 8: T2 · axial · 5.0mm · 1.56mm/px · 1 of 44 slices shown (3 of 3)]
[im 1/44]
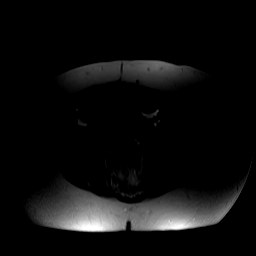

[Series 9: axial in out · axial · 6.0mm · 0.78mm/px · z∈[-111,+165]mm · 2 of 82 slices shown]
[im 1/82]
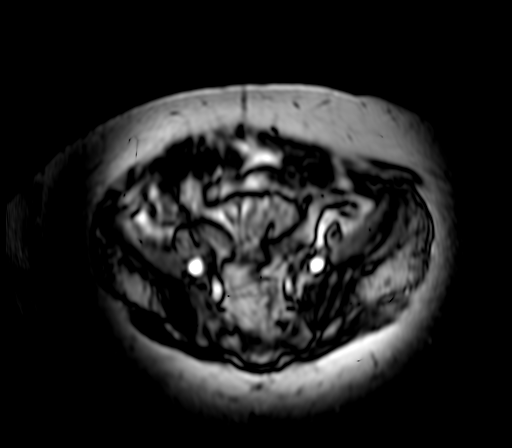
[im 82/82]
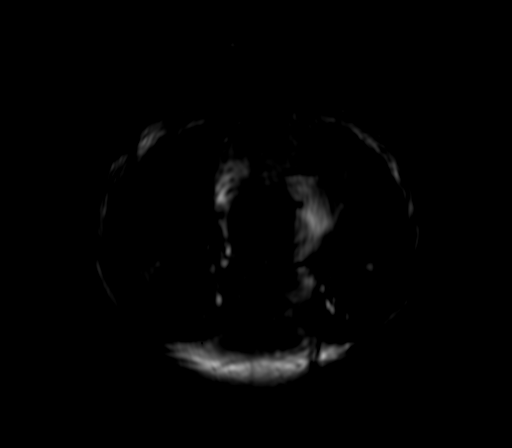

[Series 10: T1 dynamic · axial · non-contrast · 2.2mm · 0.78mm/px · z∈[-110,+170]mm · 4 of 128 slices shown]
[im 1/128]
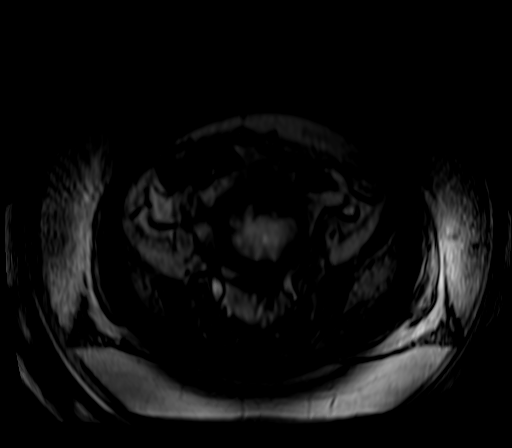
[im 43/128]
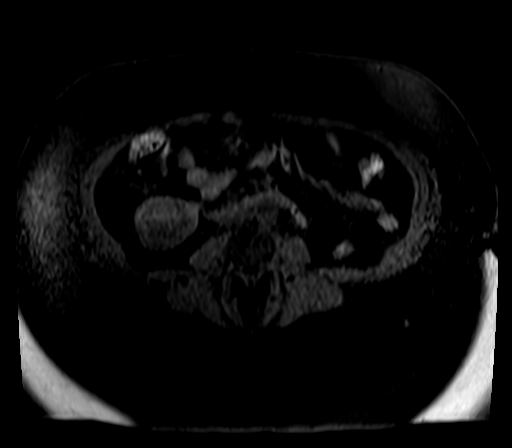
[im 85/128]
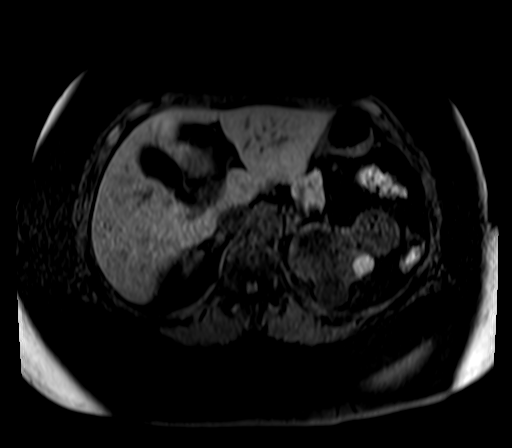
[im 128/128]
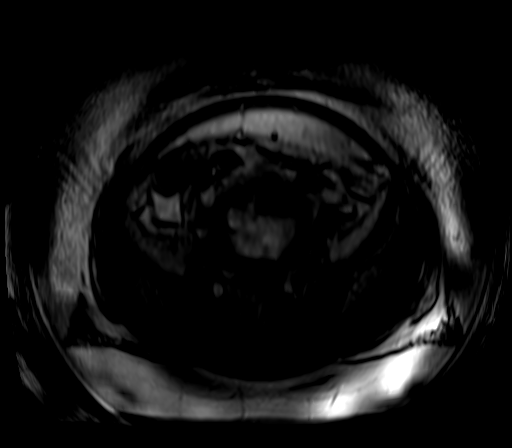

[Series 11: post 25 sec · axial · 2.2mm · 0.78mm/px · z∈[-110,+170]mm · 4 of 128 slices shown]
[im 1/128]
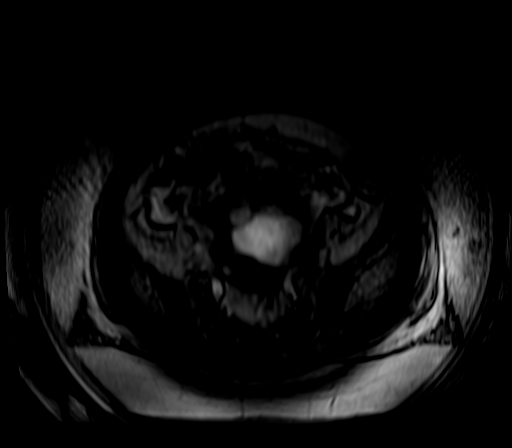
[im 43/128]
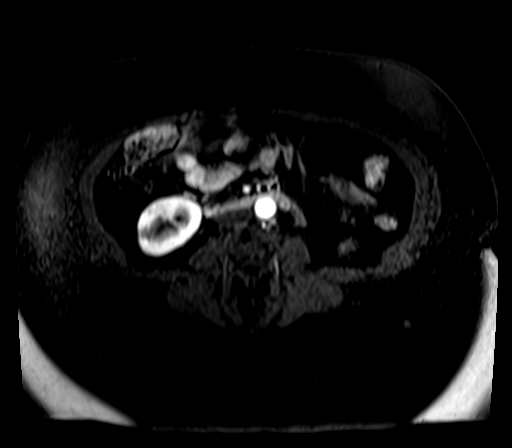
[im 85/128]
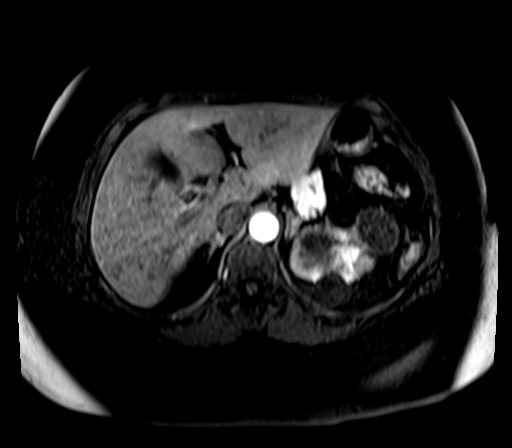
[im 128/128]
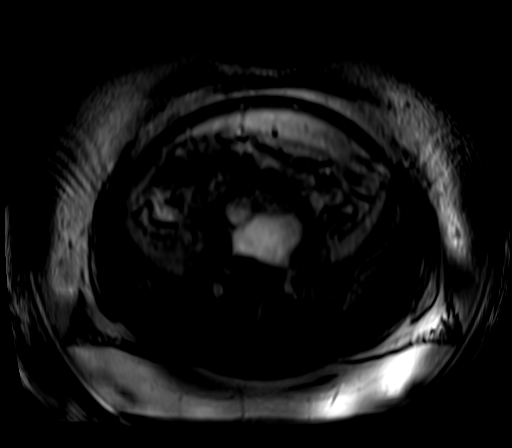

[Series 12: post 25 sec_sub · axial · 2.2mm · 0.78mm/px · z∈[-110,+170]mm · 4 of 128 slices shown]
[im 1/128]
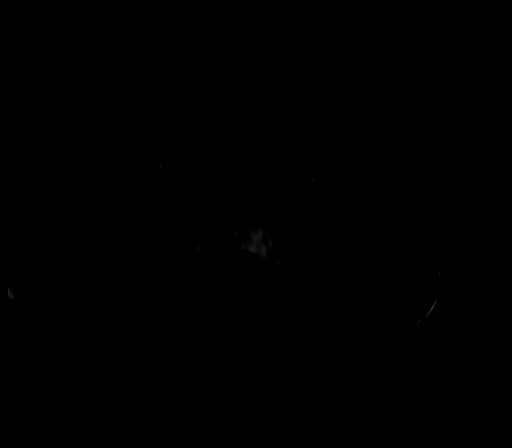
[im 43/128]
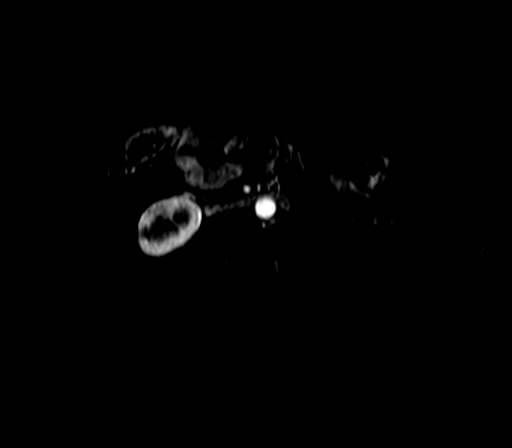
[im 85/128]
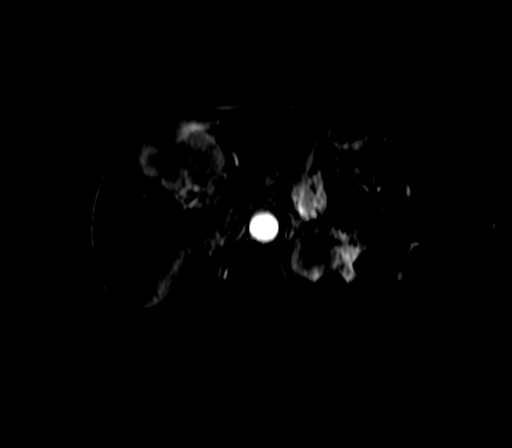
[im 128/128]
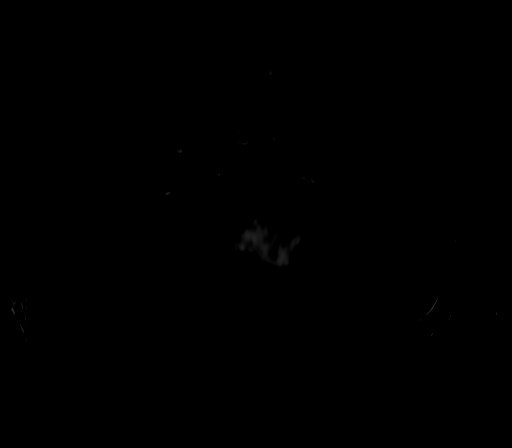

[Series 13: post 45 sec · axial · 2.2mm · 0.78mm/px · z∈[-110,+170]mm · 4 of 128 slices shown]
[im 1/128]
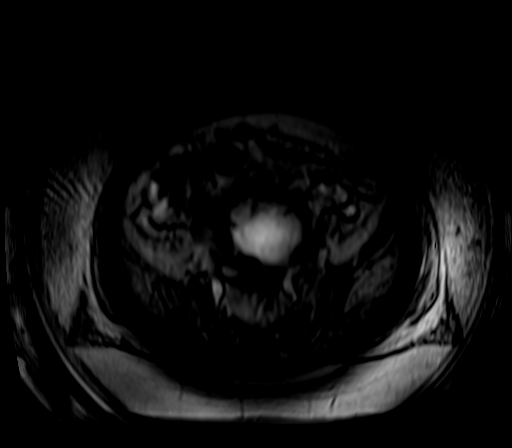
[im 43/128]
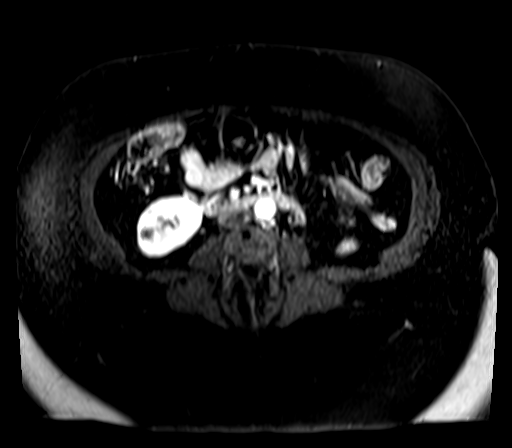
[im 85/128]
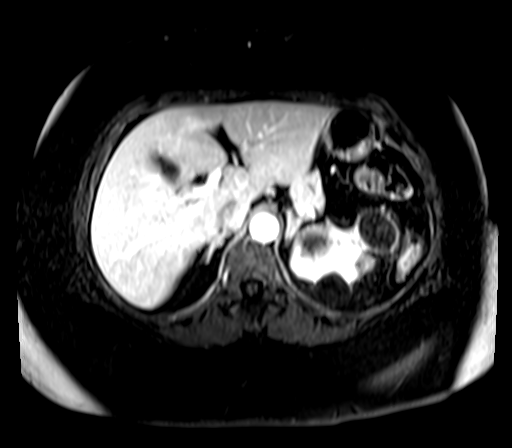
[im 128/128]
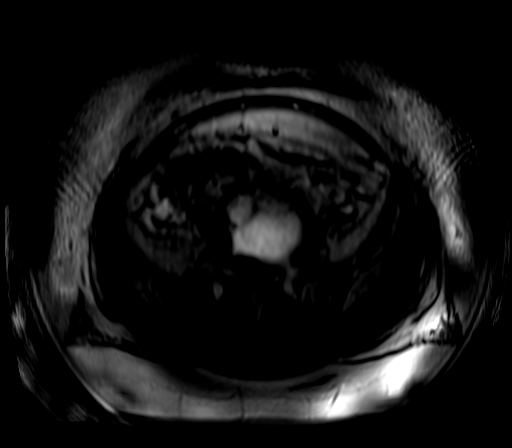

[26 of 48 positions shown; findings below may reference images not displayed]

FINDINGS: Lower chest: Unremarkable.

Hepatobiliary: No suspicious focal abnormality within the liver
parenchyma. Small gallstone noted in the neck of the gallbladder. No
intrahepatic or extrahepatic biliary dilation.

Pancreas: No focal mass lesion. No dilatation of the main duct. No
intraparenchymal cyst. No peripancreatic edema. Pancreas divisum
ductal anatomy noted.

Spleen:  No splenomegaly. No focal mass lesion.

Adrenals/Urinary Tract: No adrenal nodule or mass. Multiple simple
cysts of varying size are seen in the right kidney with 3.3 cm
complex (Bosniak II) cyst noted in the upper interpolar right
kidney.

5.9 cm exophytic cyst lower pole right kidney has a tiny 9 mm
enhancing mural nodule posterolaterally (see axial postcontrast
image 96/series 15 and coronal postcontrast image 28 of series [DATE] cm exophytic interpolar right renal cyst on image 75/series 15
has a medial nodule that shows low signal intensity on T2 imaging
(see image 24/T2 series 3) and high signal intensity on T1 imaging.
No definite enhancement in this nodule on subtraction imaging.

Multiple cystic lesions are also noted in the left kidney including
a 4.0 x 3.8 cm lesion in the upper interpolar left kidney with
enhancing ill-defined somewhat thickened internal septation (image
52/series 11). This is compatible with a Bosniak II F cyst.
Additional Bosniak I and II cysts are evident.

1.4 cm subcapsular lesion in the anterior lower pole the left kidney
shows enhancing internal architecture (postcontrast image 73 of
series 11). Small renal cell carcinoma not excluded.

Stomach/Bowel: Stomach is unremarkable. No gastric wall thickening.
No evidence of outlet obstruction. Duodenum is normally positioned
as is the ligament of Treitz. No small bowel or colonic dilatation
within the visualized abdomen.

Vascular/Lymphatic: No abdominal aortic aneurysm. No abdominal
lymphadenopathy

Other:  No intraperitoneal free fluid.

Musculoskeletal: No focal suspicious marrow enhancement within the
visualized bony anatomy.
IMPRESSION: 1. Bilateral simple and complex renal cysts (Bosniak I and II).
2. 1.4 cm subcapsular lesion anterior lower pole left kidney with
apparent enhancing internal architecture. Assessment limited by
small size, but renal cell carcinoma a concern. Follow-up MR in 3
months could be used to assess stability.
3. 5.9 cm exophytic cyst lower pole right kidney demonstrates 9 mm
enhancing mural nodule. This is compatible with Bosniak III lesion.
Close attention on follow-up recommended.
4. 4.0 x 3.8 cm Bosniak II F cyst in the interpolar left kidney.
Attention on follow-up recommended.

## 2020-10-17 MED ORDER — GADOBENATE DIMEGLUMINE 529 MG/ML IV SOLN
20.0000 mL | Freq: Once | INTRAVENOUS | Status: AC | PRN
  Administered 2020-10-17: 20 mL via INTRAVENOUS

## 2020-10-22 ENCOUNTER — Other Ambulatory Visit: Payer: Self-pay | Admitting: Pulmonary Disease

## 2020-10-22 ENCOUNTER — Other Ambulatory Visit: Payer: Self-pay

## 2020-10-22 ENCOUNTER — Ambulatory Visit (HOSPITAL_COMMUNITY)
Admission: RE | Admit: 2020-10-22 | Discharge: 2020-10-22 | Disposition: A | Discharge status: Home / Self Care | Payer: Medicare Other | Source: Ambulatory Visit | Attending: Pulmonary Disease | Admitting: Pulmonary Disease

## 2020-10-22 DIAGNOSIS — I358 Other nonrheumatic aortic valve disorders: Secondary | ICD-10-CM | POA: Insufficient documentation

## 2020-10-22 DIAGNOSIS — R0902 Hypoxemia: Secondary | ICD-10-CM | POA: Diagnosis not present

## 2020-10-22 DIAGNOSIS — E785 Hyperlipidemia, unspecified: Secondary | ICD-10-CM | POA: Insufficient documentation

## 2020-10-22 DIAGNOSIS — G4730 Sleep apnea, unspecified: Secondary | ICD-10-CM | POA: Insufficient documentation

## 2020-10-22 DIAGNOSIS — R0609 Other forms of dyspnea: Secondary | ICD-10-CM | POA: Diagnosis not present

## 2020-10-22 DIAGNOSIS — E119 Type 2 diabetes mellitus without complications: Secondary | ICD-10-CM | POA: Insufficient documentation

## 2020-10-22 LAB — ECHOCARDIOGRAM COMPLETE
Area-P 1/2: 3.72 cm2
S' Lateral: 2.6 cm

## 2020-10-22 NOTE — Progress Notes (Signed)
  Echocardiogram 2D Echocardiogram has been performed.  Karen Black 10/22/2020, 10:11 AM

## 2020-10-26 ENCOUNTER — Encounter (HOSPITAL_BASED_OUTPATIENT_CLINIC_OR_DEPARTMENT_OTHER): Payer: Self-pay

## 2020-10-26 DIAGNOSIS — G4733 Obstructive sleep apnea (adult) (pediatric): Secondary | ICD-10-CM

## 2020-11-09 ENCOUNTER — Other Ambulatory Visit (HOSPITAL_COMMUNITY)
Admission: RE | Admit: 2020-11-09 | Discharge: 2020-11-09 | Disposition: A | Discharge status: Home / Self Care | Payer: Medicare Other | Source: Ambulatory Visit | Attending: Pulmonary Disease | Admitting: Pulmonary Disease

## 2020-11-09 DIAGNOSIS — Z01812 Encounter for preprocedural laboratory examination: Secondary | ICD-10-CM | POA: Diagnosis not present

## 2020-11-09 DIAGNOSIS — Z20822 Contact with and (suspected) exposure to covid-19: Secondary | ICD-10-CM | POA: Insufficient documentation

## 2020-11-09 LAB — SARS CORONAVIRUS 2 (TAT 6-24 HRS): SARS Coronavirus 2: NEGATIVE

## 2020-11-12 ENCOUNTER — Encounter: Payer: Self-pay | Admitting: Pulmonary Disease

## 2020-11-12 ENCOUNTER — Ambulatory Visit (INDEPENDENT_AMBULATORY_CARE_PROVIDER_SITE_OTHER): Payer: Medicare Other | Admitting: Pulmonary Disease

## 2020-11-12 ENCOUNTER — Ambulatory Visit: Payer: Medicare Other | Admitting: Pulmonary Disease

## 2020-11-12 ENCOUNTER — Other Ambulatory Visit: Payer: Self-pay

## 2020-11-12 VITALS — BP 118/68 | HR 69 | Temp 97.4°F | Ht 64.0 in | Wt 210.0 lb

## 2020-11-12 DIAGNOSIS — Z9989 Dependence on other enabling machines and devices: Secondary | ICD-10-CM

## 2020-11-12 DIAGNOSIS — R0602 Shortness of breath: Secondary | ICD-10-CM

## 2020-11-12 DIAGNOSIS — G4733 Obstructive sleep apnea (adult) (pediatric): Secondary | ICD-10-CM | POA: Diagnosis not present

## 2020-11-12 DIAGNOSIS — J439 Emphysema, unspecified: Secondary | ICD-10-CM | POA: Diagnosis not present

## 2020-11-12 DIAGNOSIS — J9611 Chronic respiratory failure with hypoxia: Secondary | ICD-10-CM

## 2020-11-12 LAB — PULMONARY FUNCTION TEST
DL/VA % pred: 107 %
DL/VA: 4.39 ml/min/mmHg/L
DLCO cor % pred: 73 %
DLCO cor: 14.03 ml/min/mmHg
DLCO unc % pred: 73 %
DLCO unc: 14.03 ml/min/mmHg
FEF 25-75 Post: 2.3 L/sec
FEF 25-75 Pre: 1.68 L/sec
FEF2575-%Change-Post: 36 %
FEF2575-%Pred-Post: 152 %
FEF2575-%Pred-Pre: 111 %
FEV1-%Change-Post: 9 %
FEV1-%Pred-Post: 96 %
FEV1-%Pred-Pre: 88 %
FEV1-Post: 1.65 L
FEV1-Pre: 1.5 L
FEV1FVC-%Change-Post: 0 %
FEV1FVC-%Pred-Pre: 108 %
FEV6-%Change-Post: 9 %
FEV6-%Pred-Post: 93 %
FEV6-%Pred-Pre: 85 %
FEV6-Post: 1.98 L
FEV6-Pre: 1.81 L
FEV6FVC-%Pred-Post: 104 %
FEV6FVC-%Pred-Pre: 104 %
FVC-%Change-Post: 9 %
FVC-%Pred-Post: 89 %
FVC-%Pred-Pre: 82 %
FVC-Post: 1.98 L
FVC-Pre: 1.81 L
Post FEV1/FVC ratio: 83 %
Post FEV6/FVC ratio: 100 %
Pre FEV1/FVC ratio: 83 %
Pre FEV6/FVC Ratio: 100 %
RV % pred: 71 %
RV: 1.63 L
TLC % pred: 70 %
TLC: 3.58 L

## 2020-11-12 MED ORDER — STIOLTO RESPIMAT 2.5-2.5 MCG/ACT IN AERS: 2.0000 | INHALATION_SPRAY | Freq: Every day | RESPIRATORY_TRACT | 0 refills | Status: DC

## 2020-11-12 MED ORDER — STIOLTO RESPIMAT 2.5-2.5 MCG/ACT IN AERS: 2.0000 | INHALATION_SPRAY | Freq: Every day | RESPIRATORY_TRACT | 2 refills | Status: DC

## 2020-11-12 NOTE — Patient Instructions (Signed)
Start stiolto inhaler 2 puffs daily  Continue to use albuterol inhaler as needed 1-2 puffs every 4-6 hours  Use 3L of oxygen at all times.  We will be in touch with you about the next procedure with cardiology to evaluate your heart.

## 2020-11-12 NOTE — Patient Instructions (Signed)
Full PFT performed today. °

## 2020-11-12 NOTE — Progress Notes (Signed)
Full PFT performed today. °

## 2020-11-12 NOTE — Progress Notes (Addendum)
Synopsis: Referred in March 2022 by Eric Form, NP for low oxygen saturations  Subjective:   PATIENT ID: Karen Black GENDER: female DOB: 01-23-1946, MRN: [id number]   HPI  Chief Complaint  Patient presents with  . Follow-up    1 mo f/u after PFT. States she has been doing well with the albuterol inhaler. Today has been the first day in a month that her breathing has gotten worse. Increased SOB.     Karen Black is a 75 year old woman, former smoker with diabetes and sleep apnea on CPAP who returns to pulmonary clinic for low oxygen saturations.   On simple walk in our office on 10/05/20 she had SpO2 saturations to 77% upon the first lap of walking with rapid return to 95% SpO2 with rest. She did not wish to be started on supplemental oxygen at that time. She has similar declines in the office again today upon ambulation. Overall her breathing has been stable since last visit. She does notice benefit from the albuterol inhaler.   ECHO on 10/22/2020 shows normal LVEF and function, normal RV function and size with mildly elevated PA systolic pressure. She has mildly enlarged left and right atria. Unable to perform bubble study due to difficulty obtaining IV access but there is reported possible shunting on this TTE study.  Pulmonary function tests today show mild restrictive and diffusion defects.   I spoke with Karen Levering PA at [facility] after the last visit about having her sleep apnea re-assessed for concern of needing supplemental oxygen with her CPAP machine. Patient reports she is scheduled for an in lab sleep study in July.   She reports she will be flying to [facility] soon to watch the promotion of her daughter who is in the [employer].  Past Medical History:  Diagnosis Date  . Diabetes ([place])   . Dyslipidemia   . Hypothyroidism   . Sleep apnea      Family History  Problem Relation Age of Onset  . Breast cancer Sister 11     Social History    Socioeconomic History  . Marital status: Divorced    Spouse name: Not on file  . Number of children: Not on file  . Years of education: Not on file  . Highest education level: Not on file  Occupational History  . Not on file  Tobacco Use  . Smoking status: Former Research scientist (life sciences)  . Smokeless tobacco: Never Used  Substance and Sexual Activity  . Alcohol use: No  . Drug use: Not on file  . Sexual activity: Not on file  Other Topics Concern  . Not on file  Social History Narrative  . Not on file   Social Determinants of Health   Financial Resource Strain: Not on file  Food Insecurity: Not on file  Transportation Needs: Not on file  Physical Activity: Not on file  Stress: Not on file  Social Connections: Not on file  Intimate Partner Violence: Not on file     No Known Allergies   Outpatient Medications Prior to Visit  Medication Sig Dispense Refill  . albuterol (VENTOLIN HFA) 108 (90 Base) MCG/ACT inhaler Inhale 2 puffs into the lungs every 6 (six) hours as needed for wheezing or shortness of breath. 8 g 6  . amLODipine (NORVASC) 5 MG tablet Take 5 mg by mouth daily.    Marland Kitchen atorvastatin (LIPITOR) 10 MG tablet     . levothyroxine (SYNTHROID, LEVOTHROID) 75 MCG tablet     .  metFORMIN (GLUCOPHAGE) 500 MG tablet Take 500 mg by mouth 2 (two) times daily.    . propranolol ER (INDERAL LA) 160 MG SR capsule     . quinapril (ACCUPRIL) 40 MG tablet 40 mg 2 (two) times daily.     Marland Kitchen triamterene-hydrochlorothiazide (DYAZIDE) 37.5-25 MG capsule Take 1 capsule by mouth daily.     No facility-administered medications prior to visit.    Review of Systems  Constitutional: Negative for chills, fever, malaise/fatigue and weight loss.  HENT: Negative for congestion, sinus pain and sore throat.   Eyes: Negative.   Respiratory: Positive for shortness of breath. Negative for cough, hemoptysis, sputum production and wheezing.   Cardiovascular: Negative for chest pain, palpitations, orthopnea,  claudication and leg swelling.  Gastrointestinal: Negative for abdominal pain, heartburn, nausea and vomiting.  Genitourinary: Negative.   Musculoskeletal: Negative for joint pain and myalgias.  Skin: Negative for rash.  Neurological: Negative for weakness.  Endo/Heme/Allergies: Negative.   Psychiatric/Behavioral: Negative.     Objective:   Vitals:   11/12/20 1152  BP: 118/68  Pulse: 69  Temp: (!) 97.4 F (36.3 C)  TempSrc: Temporal  SpO2: 94%  Weight: 210 lb (95.3 kg)  Height: 5\' 4"  (1.626 m)     Physical Exam Constitutional:      General: She is not in acute distress.    Appearance: She is obese. She is not ill-appearing.  HENT:     Head: Normocephalic and atraumatic.  Eyes:     General: No scleral icterus.    Conjunctiva/sclera: Conjunctivae normal.     Pupils: Pupils are equal, round, and reactive to light.  Cardiovascular:     Rate and Rhythm: Normal rate and regular rhythm.     Pulses: Normal pulses.     Heart sounds: Normal heart sounds. No murmur heard.   Pulmonary:     Effort: Pulmonary effort is normal.     Breath sounds: Normal breath sounds. No wheezing, rhonchi or rales.  Abdominal:     General: Bowel sounds are normal.     Palpations: Abdomen is soft.  Musculoskeletal:     Right lower leg: No edema.     Left lower leg: No edema.  Lymphadenopathy:     Cervical: No cervical adenopathy.  Skin:    General: Skin is warm and dry.  Neurological:     General: No focal deficit present.     Mental Status: She is alert.  Psychiatric:        Mood and Affect: Mood normal.        Behavior: Behavior normal.        Thought Content: Thought content normal.        Judgment: Judgment normal.    CBC No results found for: WBC, RBC, HGB, HCT, PLT, MCV, MCH, MCHC, RDW, LYMPHSABS, MONOABS, EOSABS, BASOSABS   Chest imaging: CT Chest Lung Cancer Screening 09/15/20 1. Lung-RADS 2s, benign appearance or behavior. Continue annual screening with low-dose chest CT  without contrast in 12 months.  Mediastinum/Nodes: Normal appearance of the thyroid gland. The trachea appears patent and is midline. Normal appearance of the esophagus. No enlarged lymph nodes.  Lungs/Pleura: Mild changes of centrilobular and paraseptal emphysema. Scarring is noted within both lung bases. Several tiny nodules are identified. The largest is in the superior segment of left lower lobe with an equivalent diameter of 3.9 mm. No suspicious lung nodules identified at this time.  There is a conglomeration of multiple large complicated cyst arising off the upper  pole of the left kidney. This is incompletely characterized without IV contrast material  PFT: PFT Results Latest Ref Rng & Units 11/12/2020  FVC-Pre L 1.81  FVC-Predicted Pre % 82  FVC-Post L 1.98  FVC-Predicted Post % 89  Pre FEV1/FVC % % 83  Post FEV1/FCV % % 83  FEV1-Pre L 1.50  FEV1-Predicted Pre % 88  FEV1-Post L 1.65  DLCO uncorrected ml/min/mmHg 14.03  DLCO UNC% % 73  DLCO corrected ml/min/mmHg 14.03  DLCO COR %Predicted % 73  DLVA Predicted % 107  TLC L 3.58  TLC % Predicted % 70  RV % Predicted % 71       Assessment & Plan:   Pulmonary emphysema, unspecified emphysema type ([place]) - Plan: Tiotropium Bromide-Olodaterol (STIOLTO RESPIMAT) 2.5-2.5 MCG/ACT AERS, Ambulatory Referral for DME  Obstructive sleep apnea on CPAP  Chronic hypoxemic respiratory failure ([place]) - Plan: Ambulatory Referral for DME, ECHO TEE  Discussion: Karen Black is a 75 year old woman, former smoker with diabetes and sleep apnea on CPAP who returns to pulmonary clinic for low oxygen saturations.   She has evidence of pulmonary emphysema on CT imaging along with mild restrictive and diffusion defects on pulmonary function testing. The echo is concerning for mild pulmonary artery systolic pressures but normal RV function and size.    Her degree of hypoxemia on exertion does not seem to fit her CT chest findings or PFT  findings.   TTE was not able to completely evaluate for shunt physiology and a TEE is recommended for further evaluation. I believe it is important to further evaluate shunt physiology for intracardiac or extracardiac shunting.   We will start her on 2-3L of supplemental oxygen today. Discussed the importance of this if she is planning to fly in the near future. We have provided her with a letter stating her necessity for oxygen while in flight.   She has in lab sleep study scheduled for July 2022 via [facility].   Freda Jackson, MD [facility] Office: [phone]   Current Outpatient Medications:  .  albuterol (VENTOLIN HFA) 108 (90 Base) MCG/ACT inhaler, Inhale 2 puffs into the lungs every 6 (six) hours as needed for wheezing or shortness of breath., Disp: 8 g, Rfl: 6 .  amLODipine (NORVASC) 5 MG tablet, Take 5 mg by mouth daily., Disp: , Rfl:  .  atorvastatin (LIPITOR) 10 MG tablet, , Disp: , Rfl:  .  levothyroxine (SYNTHROID, LEVOTHROID) 75 MCG tablet, , Disp: , Rfl:  .  metFORMIN (GLUCOPHAGE) 500 MG tablet, Take 500 mg by mouth 2 (two) times daily., Disp: , Rfl:  .  propranolol ER (INDERAL LA) 160 MG SR capsule, , Disp: , Rfl:  .  quinapril (ACCUPRIL) 40 MG tablet, 40 mg 2 (two) times daily. , Disp: , Rfl:  .  Tiotropium Bromide-Olodaterol (STIOLTO RESPIMAT) 2.5-2.5 MCG/ACT AERS, Inhale 2 puffs into the lungs daily., Disp: 12 g, Rfl: 2 .  Tiotropium Bromide-Olodaterol (STIOLTO RESPIMAT) 2.5-2.5 MCG/ACT AERS, Inhale 2 puffs into the lungs daily., Disp: 4 g, Rfl: 0 .  triamterene-hydrochlorothiazide (DYAZIDE) 37.5-25 MG capsule, Take 1 capsule by mouth daily., Disp: , Rfl:

## 2020-11-12 NOTE — Progress Notes (Signed)
Patient seen in the office today and instructed on use of Stiolto.  Patient expressed understanding and demonstrated technique.  Glorene Leitzke CMA 11/12/2020 

## 2020-11-15 ENCOUNTER — Telehealth: Payer: Self-pay | Admitting: Pulmonary Disease

## 2020-11-15 NOTE — Telephone Encounter (Signed)
I believe Cherina placed the DME order that I signed off on for the POC via [employer]. If they could assess her for her O2 needs on the POC that would be great unless we can do it quicker in office for her.   She required 2-3L continuous O2 on simple walk when in clinic which was documented.   Thanks, Wille Glaser

## 2020-11-15 NOTE — Telephone Encounter (Signed)
Called and spoke to patient.  Patient stated that [employer] delivered oxygen on 4/29, however they did not deliver POC and this is needed for travel.  Order placed on 11/12/2020 for continuous with stationary and POC. I have spoken to [facility] with [employer], who stated that patient would need a eval with [employer] or our office would need to placed a new order with the liter flow for POC.  Patient was walked during 11/12/2020 office visit on continuous oxygen. It does not look like like patient was walked with POC.   Dr. Erin Fulling, please advise on POC and liter flow? Thanks

## 2020-11-16 NOTE — Telephone Encounter (Signed)
lmtcb for pt.   Pt needs to be put on 6MWT schedule to be evaluated on POC.

## 2020-11-17 NOTE — Telephone Encounter (Signed)
Lmtcb for pt.  

## 2020-11-17 NOTE — Telephone Encounter (Signed)
Called and spoke with patient who states that she is going to her daughters graduation and would call the office to set up walk test once she gets back. She states that she got a call from [employer] that she needs to do walk test for POC. Advised patient that we can do that here and [employer] gets the results. Per Melissa at [facility] stated that patient would need a eval with [employer] or our office with the POC to qualify her and a new order with the liter flow for POC. Patient was walked during 11/12/2020 office visit on continuous oxygen. It does not look like like patient was walked with POC. Nothing further needed at this time.    Pt needs to be put on 6MWT schedule to be evaluated on POC when she calls back.

## 2020-11-17 NOTE — Telephone Encounter (Signed)
Pt returning call, -pt  mentions she cant get restricted calls ,  [employer] also contacted pt & stated she needed to do a test with them at their office. pt doesnt want to schedule an appt yet because she needs to go to daughter graduation. Will call back when back in town.

## 2020-11-18 ENCOUNTER — Other Ambulatory Visit: Payer: Self-pay | Admitting: Physician Assistant

## 2020-11-18 DIAGNOSIS — Z1231 Encounter for screening mammogram for malignant neoplasm of breast: Secondary | ICD-10-CM

## 2020-11-23 SURGERY — Surgical Case
Anesthesia: *Unknown

## 2020-11-26 ENCOUNTER — Ambulatory Visit: Payer: Medicare Other | Admitting: Cardiovascular Disease

## 2020-12-02 ENCOUNTER — Other Ambulatory Visit: Payer: Self-pay

## 2020-12-02 ENCOUNTER — Ambulatory Visit: Payer: Medicare Other

## 2020-12-02 DIAGNOSIS — J9611 Chronic respiratory failure with hypoxia: Secondary | ICD-10-CM

## 2020-12-02 DIAGNOSIS — J439 Emphysema, unspecified: Secondary | ICD-10-CM

## 2020-12-12 DIAGNOSIS — J9611 Chronic respiratory failure with hypoxia: Secondary | ICD-10-CM | POA: Diagnosis not present

## 2020-12-14 ENCOUNTER — Ambulatory Visit: Payer: Medicare Other | Admitting: Cardiology

## 2020-12-27 ENCOUNTER — Ambulatory Visit (HOSPITAL_BASED_OUTPATIENT_CLINIC_OR_DEPARTMENT_OTHER): Payer: Medicare Other | Attending: Internal Medicine | Admitting: Internal Medicine

## 2020-12-27 ENCOUNTER — Other Ambulatory Visit: Payer: Self-pay

## 2020-12-27 DIAGNOSIS — R0902 Hypoxemia: Secondary | ICD-10-CM | POA: Insufficient documentation

## 2020-12-27 DIAGNOSIS — G4733 Obstructive sleep apnea (adult) (pediatric): Secondary | ICD-10-CM | POA: Diagnosis not present

## 2020-12-29 DIAGNOSIS — Z23 Encounter for immunization: Secondary | ICD-10-CM | POA: Diagnosis not present

## 2020-12-30 ENCOUNTER — Telehealth: Payer: Self-pay | Admitting: Pulmonary Disease

## 2020-12-30 NOTE — Telephone Encounter (Signed)
Called and spoke with pt who said she is scheduled to see Dr. Claiborne Billings with cardiology but not until August. Stated if she wanted to try to get a sooner appt that she could try to see if they had a cancellation list they could put her on and she verbalized understanding. Nothing further needed.

## 2021-01-01 DIAGNOSIS — G4733 Obstructive sleep apnea (adult) (pediatric): Secondary | ICD-10-CM | POA: Diagnosis not present

## 2021-01-11 ENCOUNTER — Other Ambulatory Visit: Payer: Self-pay

## 2021-01-11 ENCOUNTER — Ambulatory Visit
Admission: RE | Admit: 2021-01-11 | Discharge: 2021-01-11 | Disposition: A | Discharge status: Home / Self Care | Payer: Medicare Other | Source: Ambulatory Visit | Attending: Physician Assistant | Admitting: Physician Assistant

## 2021-01-11 DIAGNOSIS — Z1231 Encounter for screening mammogram for malignant neoplasm of breast: Secondary | ICD-10-CM

## 2021-01-11 IMAGING — MG MM DIGITAL SCREENING BILAT W/ TOMO AND CAD
8 of 14 series · 8 of 40 positions shown · non-contrast
Comparison: Previous exam(s).

ACR Breast Density Category a: The breast tissue is almost entirely
fatty.

CLINICAL DATA: Screening.

EXAM:
DIGITAL SCREENING BILATERAL MAMMOGRAM WITH TOMOSYNTHESIS AND CAD
TECHNIQUE: Bilateral screening digital craniocaudal and mediolateral oblique
mammograms were obtained. Bilateral screening digital breast
tomosynthesis was performed. The images were evaluated with
computer-aided detection.

[L MLO synth-2D (1 of 2)]
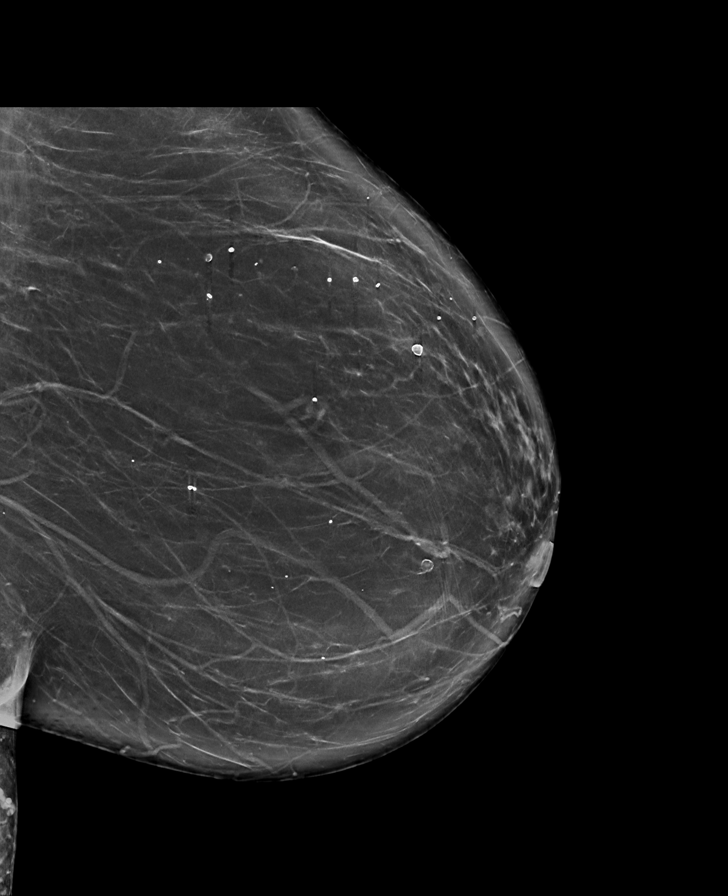

[R CC synth-2D]
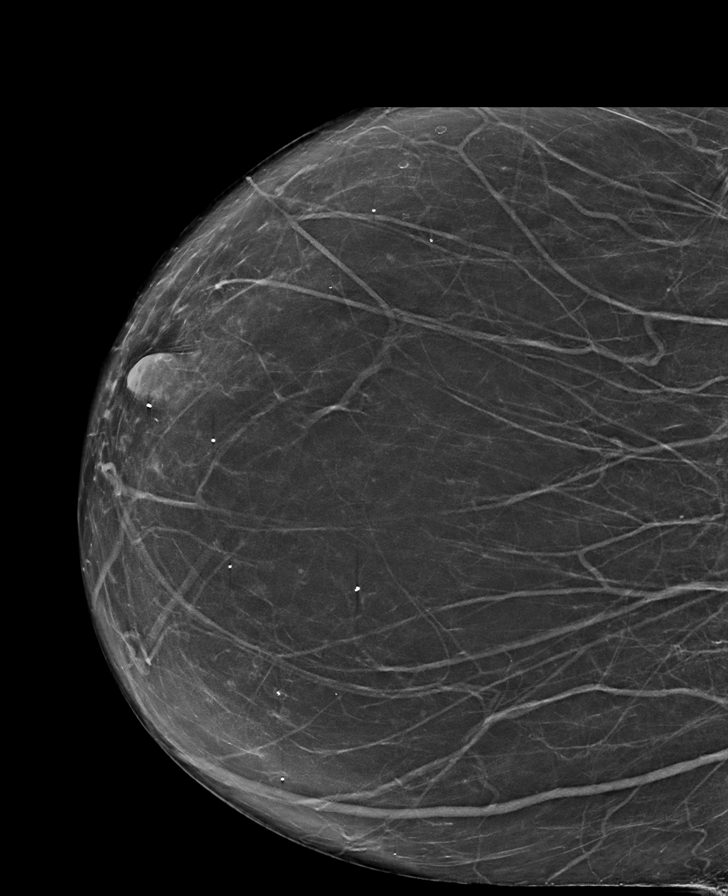

[L CV synth-2D]
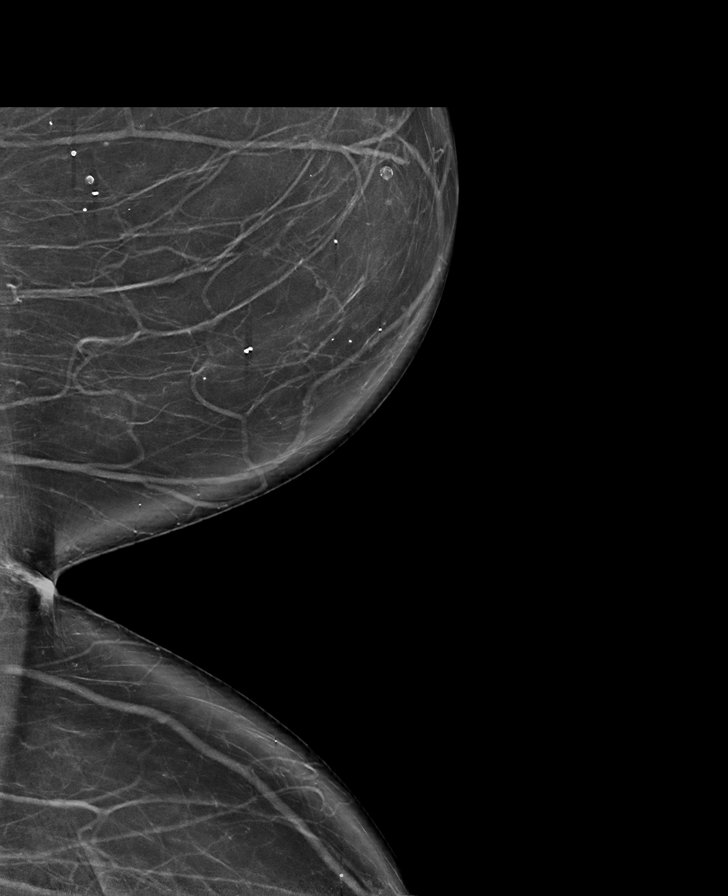

[R MLO synth-2D (1 of 2)]
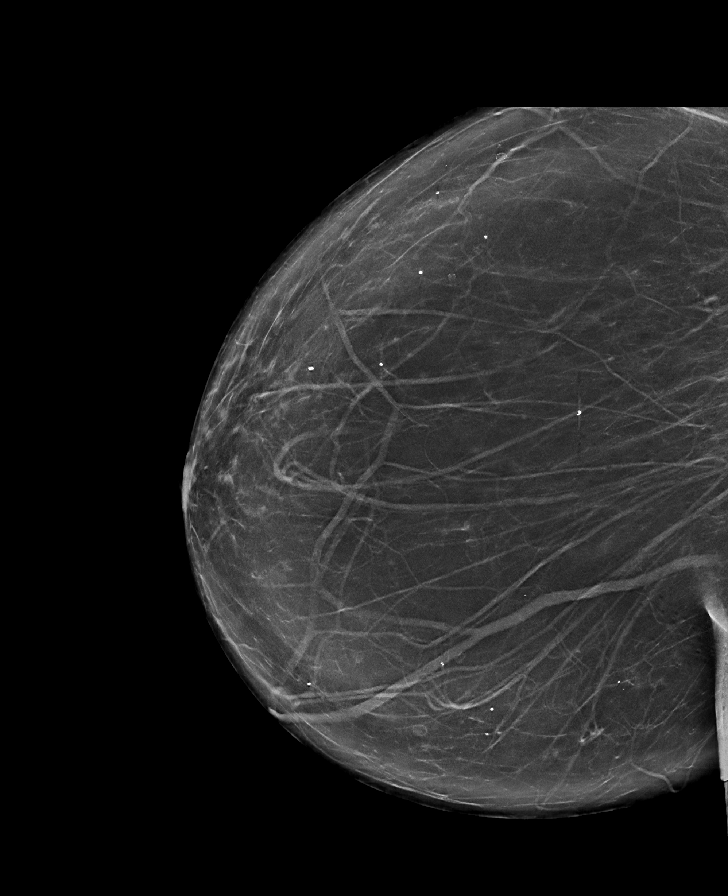

[R MLO synth-2D (2 of 2)]
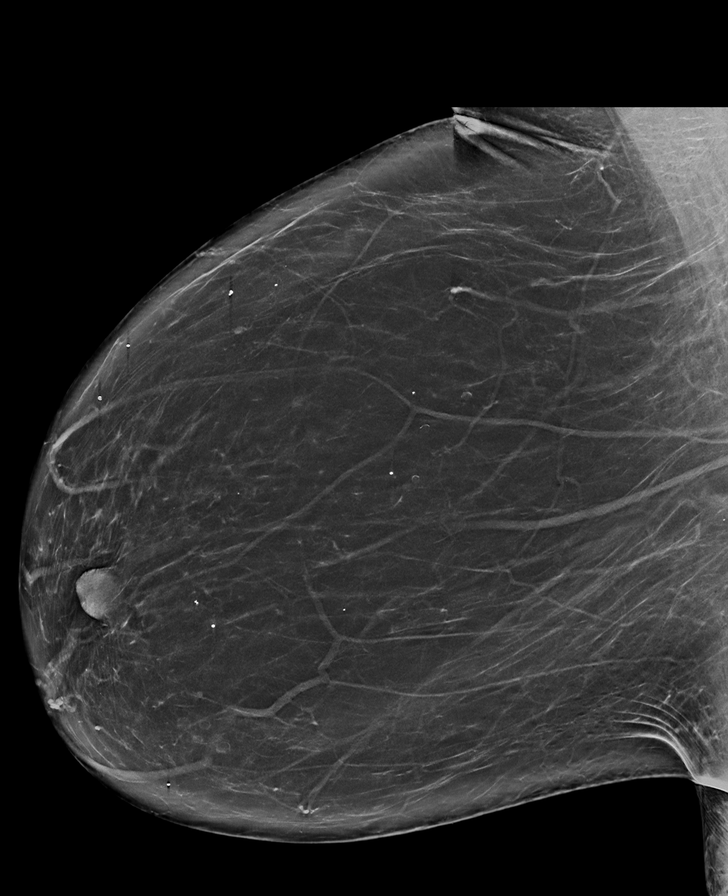

[L MLO synth-2D (2 of 2)]
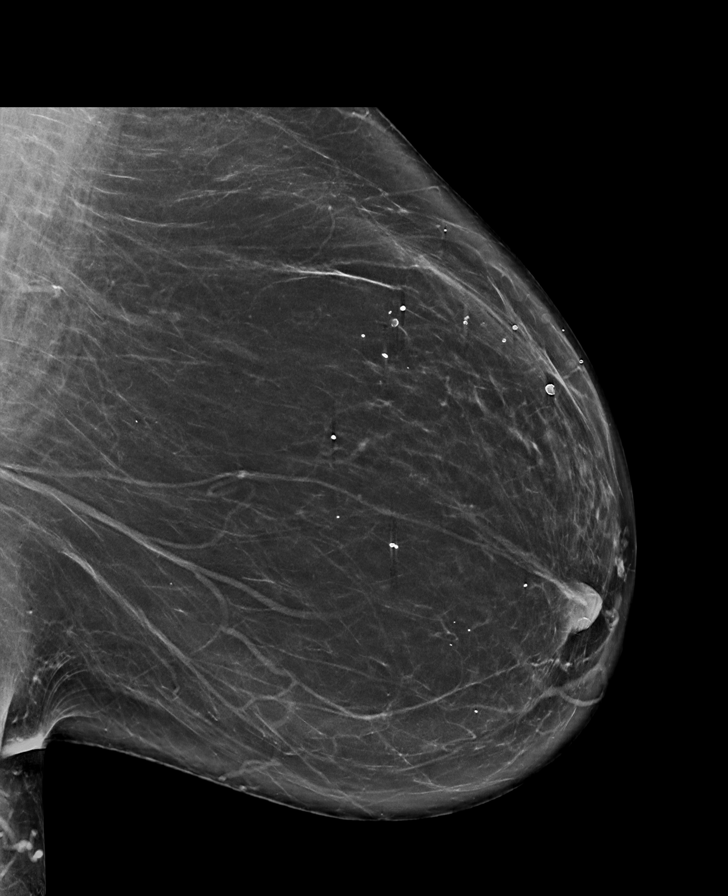

[L CC synth-2D]
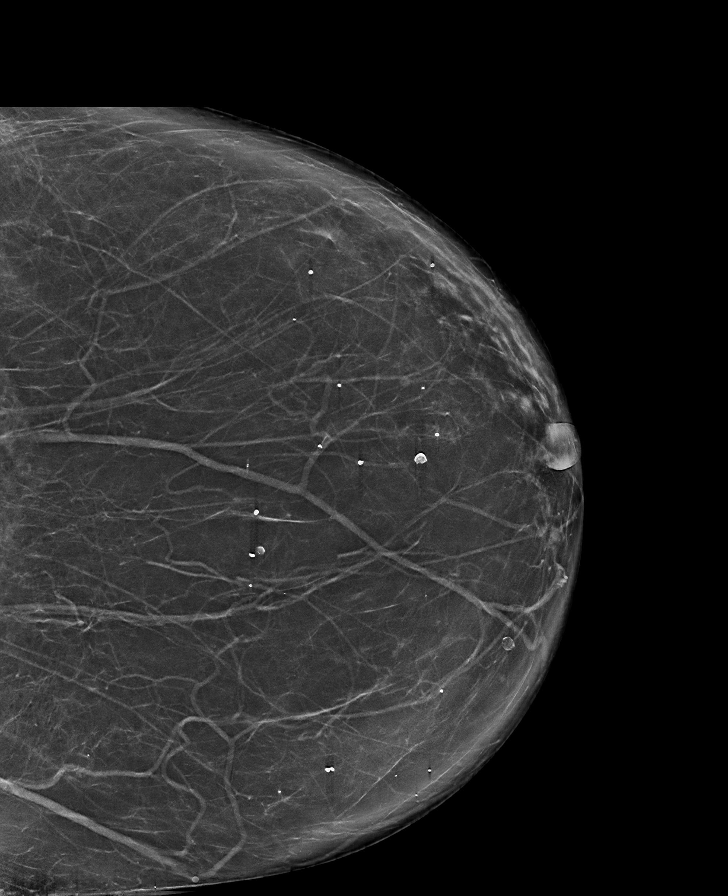

[L MLO tomo · tomo slice 47/93.0]
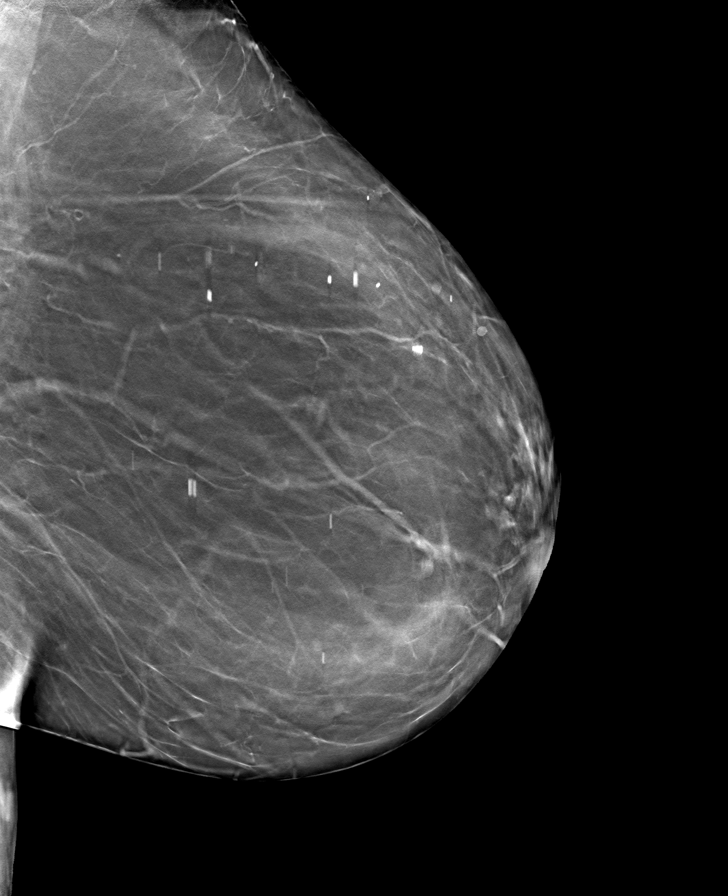

[8 of 40 positions shown; findings below may reference images not displayed]

FINDINGS: There are no findings suspicious for malignancy.
IMPRESSION: No mammographic evidence of malignancy. A result letter of this
screening mammogram will be mailed directly to the patient.

RECOMMENDATION:
Screening mammogram in one year. (Code:[8U])

BI-RADS CATEGORY  1: Negative.

## 2021-01-12 DIAGNOSIS — J9611 Chronic respiratory failure with hypoxia: Secondary | ICD-10-CM | POA: Diagnosis not present

## 2021-01-12 NOTE — Procedures (Signed)
NAME: Karen Black DATE OF BIRTH:  [birth year] MEDICAL RECORD NUMBER [id number]  LOCATION: [facility]  PHYSICIAN: Marius Ditch  DATE OF STUDY: 12/27/2020  SLEEP STUDY TYPE: Positive Airway Pressure Titration               REFERRING PHYSICIAN: Marius Ditch, MD/Jennifer Percell Miller, PA-C  EPWORTH SLEEPINESS SCORE:  4 HEIGHT: 5\' 5"  (165.1 cm)  WEIGHT: 210 lb (95.3 kg)    Body mass index is 34.95 kg/m.  NECK SIZE: 14 in.  CLINICAL INFORMATION The patient was referred to the sleep center for evaluation of persisting hypoxemia in the setting of well controlled OSA on CPAP.   MEDICATIONS Patient self administered medications include: N/A. Medications administered during study include No sleep medicine administered.Marland Kitchen  SLEEP STUDY TECHNIQUE The patient underwent an attended overnight polysomnography titration to assess the effects of cpap therapy. The following variables were monitored: EEG (C4-A1, C3-A2, O1-A2, O2-A1, F3-M2, F4-M1), EOG, submental and leg EMG, ECG, oxyhemoglobin saturation by pulse oximetry, thoracic and abdominal respiratory effort belts, nasal/oral airflow by pressure sensor, body position sensor and snoring sensor. CPAP pressure was titrated to eliminate apneas, hypopneas and oxygen desaturation.  TECHNICAL COMMENTS Comments added by Technician: O2 initiated due to low sats. Comments added by Scorer: N/A  SLEEP ARCHITECTURE The study was initiated at 9:29:02 PM and terminated at 4:22:41 AM. Total recorded time was 413.7 minutes. EEG confirmed total sleep time was 229 minutes yielding a sleep efficiency of 55.4%%. Sleep onset after lights out was 72.4 minutes with a REM latency of 69.5 minutes. The patient spent 10.7%% of the night in stage N1 sleep, 76.2%% in stage N2 sleep, 0.0%% in stage N3 and 13.1% in REM. The Arousal Index was 20.7/hour.  RESPIRATORY PARAMETERS The overall AHI was 3.1 per hour, and the RDI was 9.7 events/hour with a  central apnea index of 0.3per hour. During the titration, apneas and hypopneas were abolished at lower pressures, but the most appropriate setting to control RERAs was with BiPAP at IPAP/EPAP 24/20 cm H2O. At this setting, the sleep efficiency was 87 % and the patient was supine for 100%. At this setting, the AHI was 0 events per hour, and the RDI was 0.7 events/hour (with 0.3 central events) and the arousal index was 2 per hour.The oxygen nadir was 87.0% during sleep at this setting. However, at CPAP 13 and at CPAP 18, RDI was < 8 with decent oxygen levels.   LEG MOVEMENT DATA The total leg movements were 3 with a resulting leg movement index of 0.8. Associated arousal with leg movement index was 0.8.  CARDIAC DATA The underlying cardiac rhythm was most consistent with sinus rhythm. Mean heart rate during sleep was 57.7 bpm. Additional rhythm abnormalities include None.  IMPRESSIONS - Obstructive Sleep apnea (OSA) Optimal pressure attained, but was high to abolish all RERAs.  - Oxygen required with CPAP or with BPAP to resolve hypoxemia.  - Markedly reduced sleep efficiency. - Patient preferred mask used in the sleep study.   DIAGNOSIS - Obstructive Sleep Apnea (G47.33) - Hypoxemia requiring oxygen  RECOMMENDATIONS - Trial of BiPAP therapy on 24/20 cm H2O with a Medium size Fisher&Paykel Full Face Mask Simplus mask and heated humidification with 1 LPM oxygen. Alternatively, CPAP of 13 to 18 with oxygen may work about as well (with only a few RERAs).  Marius Ditch Sleep specialist, American Board of Internal Medicine  ELECTRONICALLY SIGNED ON:  01/12/2021, 6:26 AM [facility] PH: [phone]   FX: [phone] [place]

## 2021-01-13 DIAGNOSIS — G4733 Obstructive sleep apnea (adult) (pediatric): Secondary | ICD-10-CM | POA: Diagnosis not present

## 2021-01-25 ENCOUNTER — Other Ambulatory Visit: Payer: Self-pay | Admitting: Physician Assistant

## 2021-01-25 DIAGNOSIS — N281 Cyst of kidney, acquired: Secondary | ICD-10-CM

## 2021-01-27 DIAGNOSIS — I10 Essential (primary) hypertension: Secondary | ICD-10-CM | POA: Diagnosis not present

## 2021-01-27 DIAGNOSIS — E890 Postprocedural hypothyroidism: Secondary | ICD-10-CM | POA: Diagnosis not present

## 2021-01-27 DIAGNOSIS — E1122 Type 2 diabetes mellitus with diabetic chronic kidney disease: Secondary | ICD-10-CM | POA: Diagnosis not present

## 2021-01-27 DIAGNOSIS — N183 Chronic kidney disease, stage 3 unspecified: Secondary | ICD-10-CM | POA: Diagnosis not present

## 2021-01-27 DIAGNOSIS — E1169 Type 2 diabetes mellitus with other specified complication: Secondary | ICD-10-CM | POA: Diagnosis not present

## 2021-01-27 DIAGNOSIS — E785 Hyperlipidemia, unspecified: Secondary | ICD-10-CM | POA: Diagnosis not present

## 2021-01-27 DIAGNOSIS — Z7984 Long term (current) use of oral hypoglycemic drugs: Secondary | ICD-10-CM | POA: Diagnosis not present

## 2021-02-06 ENCOUNTER — Other Ambulatory Visit: Payer: Self-pay

## 2021-02-06 ENCOUNTER — Ambulatory Visit
Admission: RE | Admit: 2021-02-06 | Discharge: 2021-02-06 | Disposition: A | Discharge status: Home / Self Care | Payer: Medicare Other | Source: Ambulatory Visit | Attending: Physician Assistant | Admitting: Physician Assistant

## 2021-02-06 DIAGNOSIS — N281 Cyst of kidney, acquired: Secondary | ICD-10-CM

## 2021-02-06 DIAGNOSIS — N2889 Other specified disorders of kidney and ureter: Secondary | ICD-10-CM | POA: Diagnosis not present

## 2021-02-06 IMAGING — MR MR ABDOMEN WO/W CM
11 of 17 series · 28 of 48 positions shown · IV contrast (multihance)
Comparison: Comparison is made with MRI of the abdomen [DATE].

CLINICAL DATA: Cystic renal lesions, follow-up evaluation in a
75-year-old female.

EXAM:
MRI ABDOMEN WITHOUT AND WITH CONTRAST
TECHNIQUE: Multiplanar multisequence MR imaging of the abdomen was performed
both before and after the administration of intravenous contrast.
CONTRAST:  19mL MULTIHANCE GADOBENATE DIMEGLUMINE 529 MG/ML IV SOLN

[Series 3: T2 · coronal · 5.0mm · 1.56mm/px · 2 of 20 slices shown (1 of 3)]
[im 1/20]
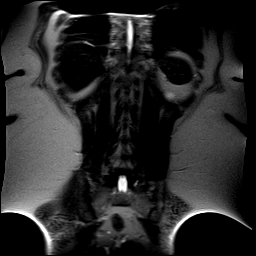
[im 20/20]
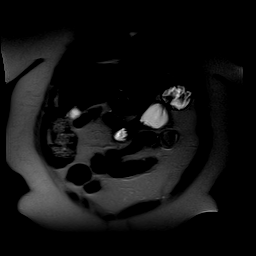

[Series 4: axial tru fisp · axial · 4.5mm · 1.48mm/px · z∈[-119,+74]mm · 3 of 34 slices shown]
[im 1/34]
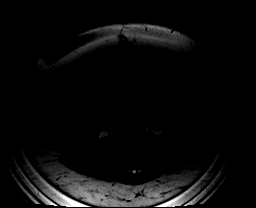
[im 17/34]
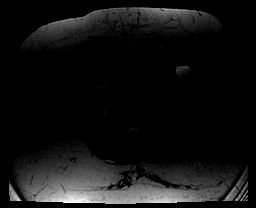
[im 34/34]
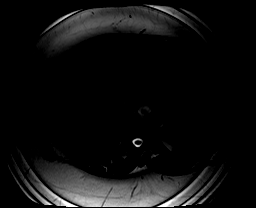

[Series 5: ep2d_diff_b50_500_800_p2 · axial · 6.0mm · 1.98mm/px · z∈[-117,+109]mm · 5 of 90 slices shown]
[im 1/90]
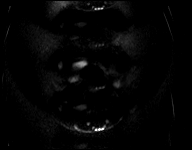
[im 23/90]
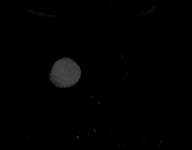
[im 45/90]
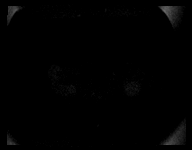
[im 67/90]
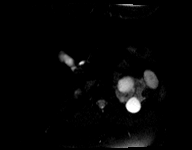
[im 90/90]
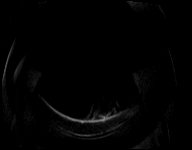

[Series 6: ep2d_diff_b50_500_800_p2_adc · axial · 6.0mm · 1.98mm/px · z∈[-117,+109]mm · 2 of 30 slices shown]
[im 1/30]
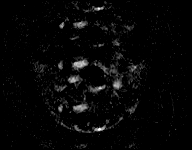
[im 30/30]
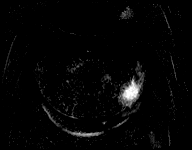

[Series 7: T2 · axial · 6.5mm · 0.72mm/px · 1 of 28 slices shown (2 of 3)]
[im 1/28]
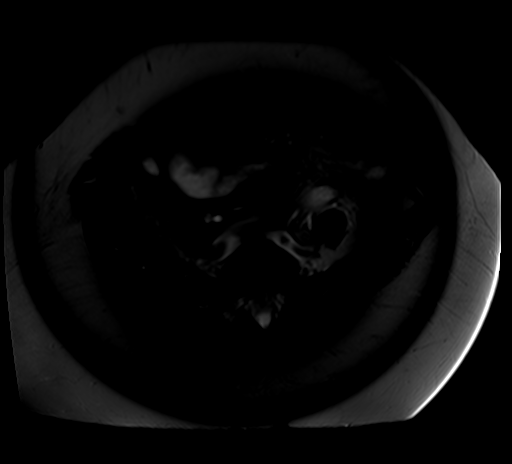

[Series 8: T2 · axial · 5.0mm · 1.37mm/px · z∈[-133,+88]mm · 2 of 35 slices shown (3 of 3)]
[im 1/35]
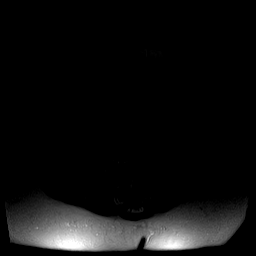
[im 35/35]
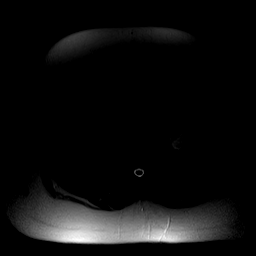

[Series 9: axial in out · axial · 5.5mm · 0.74mm/px · z∈[-115,+69]mm · 3 of 60 slices shown]
[im 1/60]
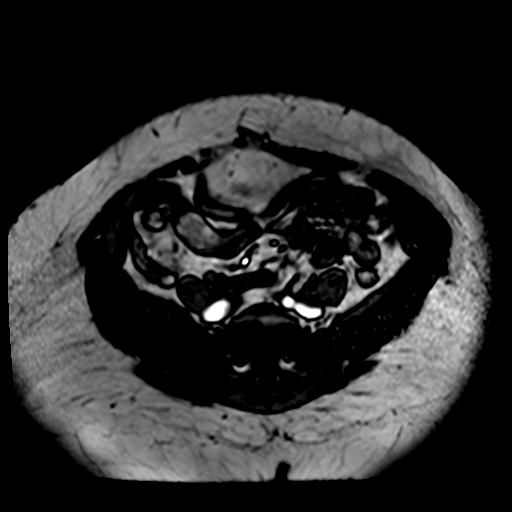
[im 30/60]
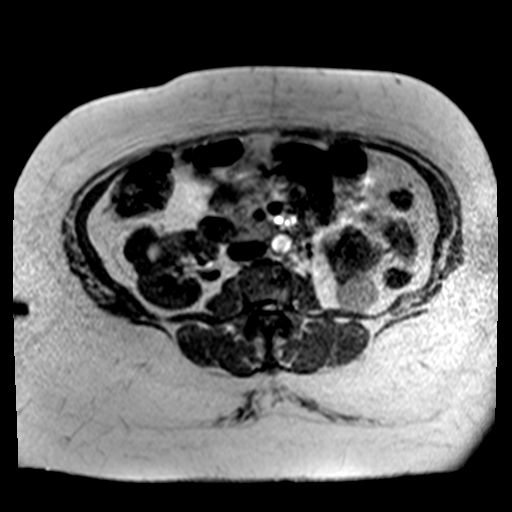
[im 60/60]
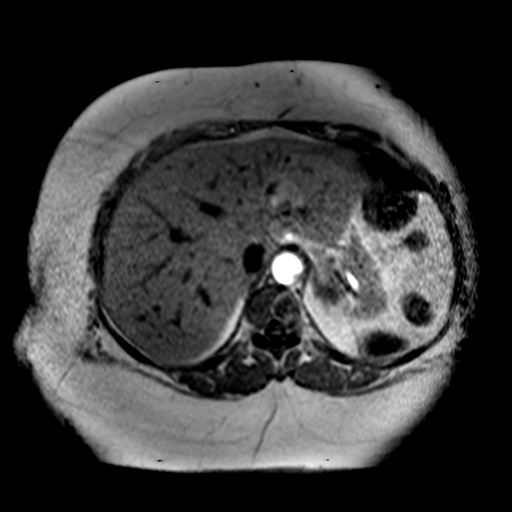

[Series 10: T1 dynamic · axial · non-contrast · 2.5mm · 0.78mm/px · z∈[-112,+66]mm · 3 of 72 slices shown]
[im 1/72]
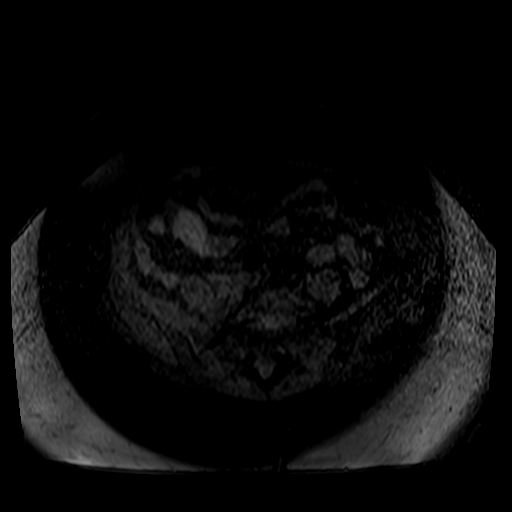
[im 36/72]
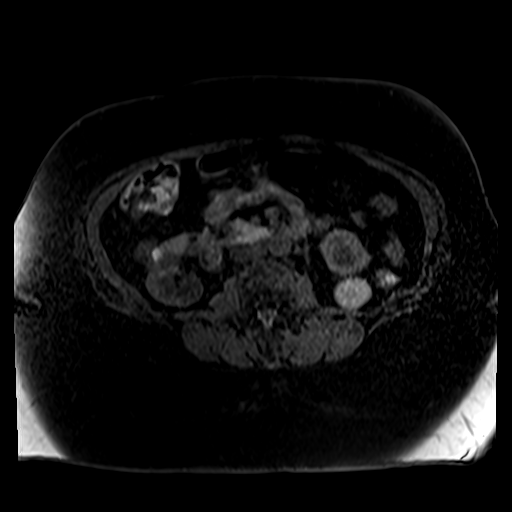
[im 72/72]
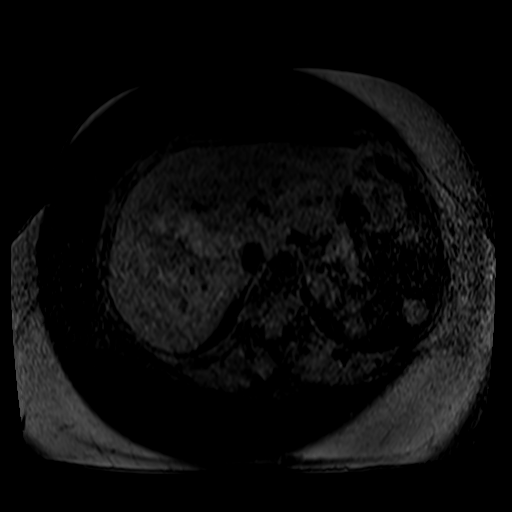

[Series 11: post 25 sec · axial · 2.5mm · 0.78mm/px · z∈[-112,+66]mm · 3 of 72 slices shown]
[im 1/72]
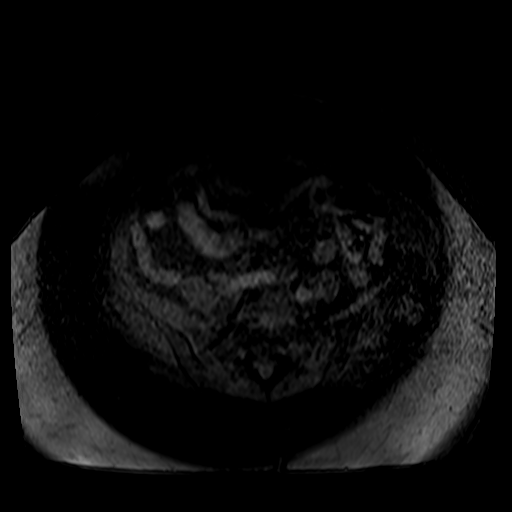
[im 36/72]
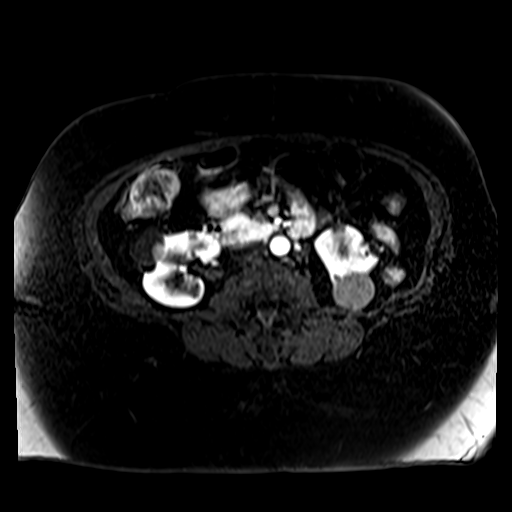
[im 72/72]
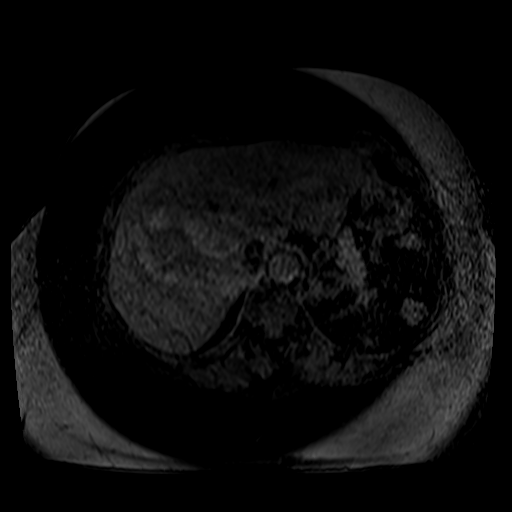

[Series 12: post 25 sec_sub · axial · 2.5mm · 0.78mm/px · z∈[-112,+66]mm · 3 of 72 slices shown]
[im 1/72]
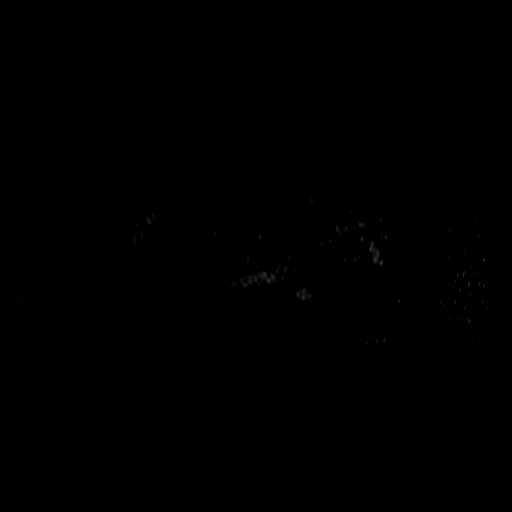
[im 36/72]
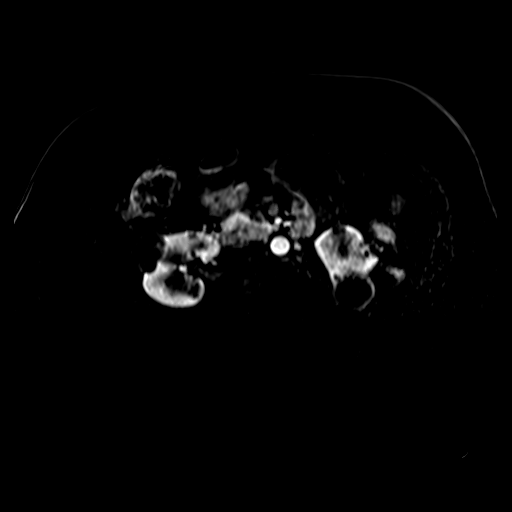
[im 72/72]
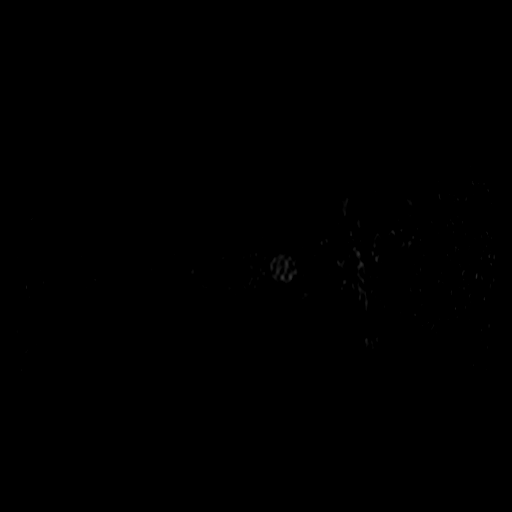

[Series 13: post 45 sec · axial · 2.5mm · 0.78mm/px · 1 of 72 slices shown]
[im 1/72]
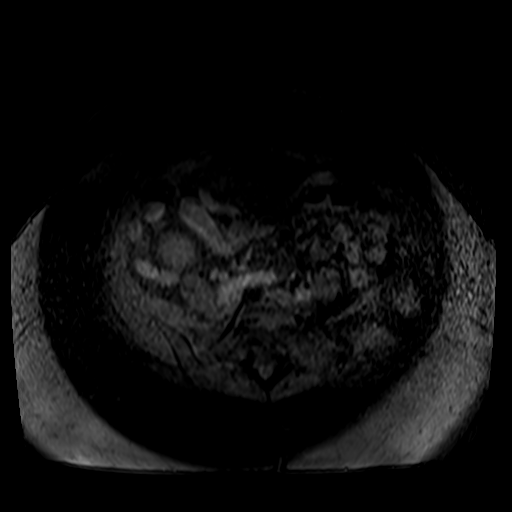

[28 of 48 positions shown; findings below may reference images not displayed]

FINDINGS: Lower chest: Incidental imaging of the lung bases is unremarkable
with limited assessment on MRI.

Hepatobiliary: Liver incompletely imaged on many of the submitted
sequences due to this being a renal focused evaluation. No focal,
suspicious hepatic lesion. Gallstone in the neck of the gallbladder
measures up to 1 cm. No biliary duct dilation. No pericholecystic
stranding.

Pancreas: Only partially imaged on most sequences. Cystic lesion in
the uncinate without signs of enhancement measuring 5 mm (image
[DATE]) also seen on image 16 of series 8. Mild prominence of the main
pancreatic duct which is seen draining into the minor papilla.

Spleen:  Incompletely imaged on most sequences.  No gross lesion.

Adrenals/Urinary Tract:  Adrenal glands are normal.

Cysts of varying complexity in the bilateral kidneys, some simple
and some with proteinaceous or hemorrhagic component are largely
similar to the prior study.

RIGHT kidney: Exophytic cystic lesion arising from the lower pole
the RIGHT kidney thin walled with the exception of the posterior
margin where there is a small enhancing focus along the posterior
margin (image 54/11) compatible with mural nodule measuring
approximately 3 mm greatest thickness forming up obtuse margins with
the wall and measuring 9 mm greatest axial dimension, unchanged
compared to previous imaging. Lesion size as a whole at 5.6 cm x
cm is also unchanged.

Other small and moderate size cystic lesions in the RIGHT kidney
display no change with an area of adherent hemorrhagic debris along
the medial margin of a cystic RIGHT renal lesion seen on image 36 of
series 11 with no change in signal intensity.

LEFT kidney: Stable appearance of many of the LEFT renal lesions,
some hemorrhagic and/or proteinaceous and some simple.

Thick wall lesion without frank mural nodularity along the LEFT
anterolateral upper-interpolar LEFT kidney measures approximately
4.1 x 3.2 cm, within 1 mm of previous measurement. Posterior wall
without mural nodularity but with crescentic thickening along the
inferior margin and question of cystic change within the wall with
thickness potentially up to 7 mm as seen on image 29 of series 17.
Otherwise uniformly thickened 2-3 mm wall is exhibited.

Small lesion along the anterior aspect lower pole of the LEFT kidney
showing some internal enhancement at 1.4 cm greatest axial dimension
(image 38/15)

No hydronephrosis.

Stomach/Bowel: No acute gastrointestinal process to the extent
evaluated.

Vascular/Lymphatic: Patent abdominal vessels. No aneurysmal
dilation. No adenopathy in the retroperitoneum.

Other:  No ascites

Musculoskeletal: No suspicious bone lesions identified.
IMPRESSION: 1. Bosniak category III lesion arises from lower pole of the RIGHT
kidney without change.
2. Previously described "Bosniak category II F" lesion along the
lateral LEFT kidney/interpolar may be better characterized as a
Bosniak category III lesion due to wall thickness. The possibility
of focal post inflammatory changes are considered based on
appearance.
3. Small enhancing lesion raising the question of small renal cell
carcinoma along the anterior margin of the lower pole of the LEFT
kidney.
4. Lesions above are stable.
5. Stable appearance otherwise of hemorrhagic and/or proteinaceous
cysts elsewhere in the kidney.
6. Small cystic lesion in the uncinate process of the pancreas
without signs of enhancement. Could consider dedicated follow-up in
2 years. There is also a question of divisum versus dominant dorsal
drainage via minor papilla.
7. Cholelithiasis.

## 2021-02-06 MED ORDER — GADOBENATE DIMEGLUMINE 529 MG/ML IV SOLN
19.0000 mL | Freq: Once | INTRAVENOUS | Status: AC | PRN
  Administered 2021-02-06: 19 mL via INTRAVENOUS

## 2021-02-11 DIAGNOSIS — J9611 Chronic respiratory failure with hypoxia: Secondary | ICD-10-CM | POA: Diagnosis not present

## 2021-02-21 ENCOUNTER — Other Ambulatory Visit: Payer: Self-pay

## 2021-02-21 ENCOUNTER — Ambulatory Visit: Payer: Medicare Other | Admitting: Cardiovascular Disease

## 2021-02-21 ENCOUNTER — Encounter: Payer: Self-pay | Admitting: Cardiovascular Disease

## 2021-02-21 DIAGNOSIS — E118 Type 2 diabetes mellitus with unspecified complications: Secondary | ICD-10-CM

## 2021-02-21 DIAGNOSIS — G4733 Obstructive sleep apnea (adult) (pediatric): Secondary | ICD-10-CM | POA: Diagnosis not present

## 2021-02-21 DIAGNOSIS — Z01818 Encounter for other preprocedural examination: Secondary | ICD-10-CM | POA: Diagnosis not present

## 2021-02-21 DIAGNOSIS — J439 Emphysema, unspecified: Secondary | ICD-10-CM | POA: Diagnosis not present

## 2021-02-21 DIAGNOSIS — G4734 Idiopathic sleep related nonobstructive alveolar hypoventilation: Secondary | ICD-10-CM | POA: Diagnosis not present

## 2021-02-21 DIAGNOSIS — I10 Essential (primary) hypertension: Secondary | ICD-10-CM | POA: Diagnosis not present

## 2021-02-21 DIAGNOSIS — R931 Abnormal findings on diagnostic imaging of heart and coronary circulation: Secondary | ICD-10-CM | POA: Diagnosis not present

## 2021-02-21 DIAGNOSIS — E039 Hypothyroidism, unspecified: Secondary | ICD-10-CM | POA: Diagnosis not present

## 2021-02-21 DIAGNOSIS — E785 Hyperlipidemia, unspecified: Secondary | ICD-10-CM | POA: Diagnosis not present

## 2021-02-21 MED ORDER — METOPROLOL SUCCINATE ER 50 MG PO TB24: 75.0000 mg | ORAL_TABLET | Freq: Every day | ORAL | 3 refills | Status: DC

## 2021-02-21 NOTE — Patient Instructions (Addendum)
Medication Instructions:   Stop taking propanolol 160 mg   Start taking  Metoprolol succinate 75 mg ( 1 and 1/2 tablet equal 75 mg  ) by daily - the first four days take 50 mg( one tablet)   Monitor pulse rate if stays below 60 contact office.  *If you need a refill on your cardiac medications before your next appointment, please call your pharmacy*   Lab Work: BMP CBC If you have labs (blood work) drawn today and your tests are completely normal, you will receive your results only by: [place] (if you have MyChart) OR A paper copy in the mail If you have any lab test that is abnormal or we need to change your treatment, we will call you to review the results.   Testing/Procedures:  Your physician has requested that you have a TEE. During a TEE, sound waves are used to create images of your heart. It provides your doctor with information about the size and shape of your heart and how well your heart's chambers and valves are working. In this test, a transducer is attached to the end of a flexible tube that's guided down your throat and into your esophagus (the tube leading from you mouth to your stomach) to get a more detailed image of your heart. You are not awake for the procedure. Please see the instruction sheet given to you today. For further information please visit [email or web address].     Follow-Up: At [facility], you and your health needs are our priority.  As part of our continuing mission to provide you with exceptional heart care, we have created designated Provider Care Teams.  These Care Teams include your primary Cardiologist (physician) and Advanced Practice Providers (APPs -  Physician Assistants and Nurse Practitioners) who all work together to provide you with the care you need, when you need it.  We recommend signing up for the patient portal called "MyChart".  Sign up information is provided on this After Visit Summary.  MyChart is used to connect with  patients for Virtual Visits (Telemedicine).  Patients are able to view lab/test results, encounter notes, upcoming appointments, etc.  Non-urgent messages can be sent to your provider as well.   To learn more about what you can do with MyChart, go to [email or web address].    Your next appointment:   3 month(s)  The format for your next appointment:   In Person  Provider:   Shelva Majestic, MD   Other Instructions  Dear Karen Black are scheduled for a TEE on  Aug 16,202 with Dr. Acie Fredrickson.  Please arrive at the [facility] (Main Entrance A) at [facility]: [address] [place], [place] [address] at 9 am   DIET: Nothing to eat or drink after midnight except a sip of water with medications (see medication instructions below)  FYI: For your safety, and to allow [place] to monitor your vital signs accurately during the surgery/procedure we request that   if you have artificial nails, gel coating, SNS etc. Please have those removed prior to your surgery/procedure. Not having the nail coverings /polish removed may result in cancellation or delay of your surgery/procedure.   Medication Instructions:  Hold no medication the morning of procedure  Labs: BMP,CBC today 02/21/21    You must have a responsible person to drive you home and stay in the waiting area during your procedure. Failure to do so could result in cancellation.  Bring your insurance cards.  *  Special Note: Every effort is made to have your procedure done on time. Occasionally there are emergencies that occur at the hospital that may cause delays. Please be patient if a delay does occur.

## 2021-02-21 NOTE — H&P (View-Only) (Signed)
Cardiology Office Note    Date:  02/28/2021   ID:  Karen Black, DOB 1946-04-05, MRN [id number]  PCP:  Karen Leriche, PA-C  Cardiologist:  Karen Majestic, MD   New cardiology evaluation referred through the courtesy of Dr. Freda Black in this patient with obstructive sleep apnea, with low oxygen saturation, and a recent transthoracic echo raising concern for possible shunting.   History of Present Illness:  Karen Black is a 74 y.o. female who has a history of prior tobacco use, diabetes mellitus, pulmonary emphysema on CT imaging along with mild restrictive and diffusion defects on pulmonary function testing and obstructive sleep apnea on CPAP therapy.  She is followed by [facility].  She was [facility] by [facility] at [facility] for low oxygen saturation.  A walk in his office in March 2022 showed an O2 desaturation to 77% with the first lap of walking and with rapid return to 95% with rest.  She was not started on supplemental oxygen at that time.  However in her April 2022 evaluation was recommended that she initiate supplemental oxygen at 2 to 3 L.  She had undergone a transthoracic echo Doppler study   on October 22, 2020 which showed an EF of 55 to 60% without wall motion abnormality.  She had normal RV function.  There was mildly elevated pulmonary artery systolic pressure, mild biatrial enlargement, as well as mild aortic valve sclerosis without stenosis.  There was concern for possible shunting across the atrial septum, not definitive. She has a history of obstructive sleep apnea for over 20 years and most recently had a follow-up sleep study by Dr. Nehemiah Black for evaluation of persisting hypoxemia in the setting of well-controlled OSA on CPAP on December 27, 2020.  The overall AHI was 3.1/h and RDI 9.7/h with a central index of 0.3/h.  During titration apneas and hypopneas were abolished at lower pressures but the most appropriate setting control where was was with BiPAP at  24/20 cm of water.  The patient was treated with supplemental oxygen at 1 L.  Alternatively CPAP of 13-18 with oxygen was felt to be adequate with only a few hours.  It is felt that the patient's degree of hypoxemia on exertion does not seem to fit her CT chest findings or PFT findings.  As result, she is now referred for cardiology evaluation for further assessment.  She has a history of hypertension and has been on amlodipine 5 mg, extended propranolol 160 mg SR, quinapril 40 mg, and triamterene/HCTZ 37.5/25 mg daily.  She has been on Stiolto Respimat and albuterol for her lungs.  She has hypothyroidism on levothyroxine 75 mcg.  She has a history of hyperlipidemia and has been on low-dose atorvastatin at 10 mg.  She also is on metformin 500 mg daily and is followed by Dr. Loanne Black.  She denies any chest pain.  She is unaware of nocturnal arrhythmias.  She denies any awareness of atrial fibrillation.  She is now on supplemental oxygen with her BiPAP therapy.  She presents for cardiology evaluation.  Past Medical History:  Diagnosis Date   Diabetes ([place])    Dyslipidemia    Hypothyroidism    Sleep apnea     Surgical history is notable for hysterectomy  Current Medications: Outpatient Medications Prior to Visit  Medication Sig Dispense Refill   albuterol (VENTOLIN HFA) 108 (90 Base) MCG/ACT inhaler Inhale 2 puffs into the lungs every 6 (six) hours as needed for wheezing or shortness of  breath. 8 g 6   amLODipine (NORVASC) 5 MG tablet Take 5 mg by mouth daily.     atorvastatin (LIPITOR) 10 MG tablet Take 10 mg by mouth daily.     levothyroxine (SYNTHROID, LEVOTHROID) 75 MCG tablet Take 75 mcg by mouth daily before breakfast.     metFORMIN (GLUCOPHAGE) 500 MG tablet Take 500 mg by mouth 2 (two) times daily.     quinapril (ACCUPRIL) 40 MG tablet Take 40 mg by mouth 2 (two) times daily.     Tiotropium Bromide-Olodaterol (STIOLTO RESPIMAT) 2.5-2.5 MCG/ACT AERS Inhale 2 puffs into the lungs daily.  (Patient not taking: No sig reported) 12 g 2   Tiotropium Bromide-Olodaterol (STIOLTO RESPIMAT) 2.5-2.5 MCG/ACT AERS Inhale 2 puffs into the lungs daily. (Patient not taking: No sig reported) 4 g 0   triamterene-hydrochlorothiazide (MAXZIDE-25) 37.5-25 MG tablet Take 1 capsule by mouth daily.     cycloSPORINE (RESTASIS) 0.05 % ophthalmic emulsion 1 drop 2 (two) times daily. (Patient not taking: Reported on 02/24/2021)     propranolol ER (INDERAL LA) 160 MG SR capsule      No facility-administered medications prior to visit.     Allergies:   Patient has no known allergies.   Social History   Socioeconomic History   Marital status: Divorced    Spouse name: Not on file   Number of children: Not on file   Years of education: Not on file   Highest education level: Not on file  Occupational History   Not on file  Tobacco Use   Smoking status: Former   Smokeless tobacco: Never  Substance and Sexual Activity   Alcohol use: No   Drug use: Not on file   Sexual activity: Not on file  Other Topics Concern   Not on file  Social History Narrative   Not on file   Social Determinants of Health   Financial Resource Strain: Not on file  Food Insecurity: Not on file  Transportation Needs: Not on file  Physical Activity: Not on file  Stress: Not on file  Social Connections: Not on file    Socially she was born in [place], he has lived in [place] for 6 years.  She is divorced.  She has 1 daughter who who lives in the police department in [place].  Patient is a retired Optometrist.  She had smoked for 30 years but quit 15 years ago.  She does not routinely exercise.   Family History:  The patient's family history includes Breast cancer (age of onset: 68) in her sister.  Both parents are deceased, mother died at age 84, father had liver cancer and died at age 89.  She has 2 deceased brothers 33 age 37 who had dementia and other age 7 who had thyroid issues.  She has 1  deceased sister who died of cancer at 67.  ROS General: Negative; No fevers, chills, or night sweats; obesity HEENT: Negative; No changes in vision or hearing, sinus congestion, difficulty swallowing Pulmonary: Pulmonary emphysema Cardiovascular: Negative; No chest pain, presyncope, syncope, palpitations GI: Negative; No nausea, vomiting, diarrhea, or abdominal pain GU: Negative; No dysuria, hematuria, or difficulty voiding Musculoskeletal: Negative; no myalgias, joint pain, or weakness Hematologic/Oncology: Negative; no easy bruising, bleeding Endocrine: Negative; no heat/cold intolerance; no diabetes Neuro: Negative; no changes in balance, headaches Skin: Negative; No rashes or skin lesions Psychiatric: Negative; No behavioral problems, depression Sleep: OSA, currently on BiPAP with supplemental O2 Other comprehensive 14 point system review  is negative.   PHYSICAL EXAM:   VS:  BP 134/78 (BP Location: Left Arm, Patient Position: Sitting, Cuff Size: Normal)   Pulse 70   Ht _0  (1.651 m)   Wt 205 lb (93 kg)   SpO2 98%   BMI 34.11 kg/m     Repeat blood pressure by me 140/78  Wt Readings from Last 3 Encounters:  02/21/21 205 lb (93 kg)  12/27/20 210 lb (95.3 kg)  11/12/20 210 lb (95.3 kg)    General: Alert, oriented, no distress.  Skin: normal turgor, no rashes, warm and dry HEENT: Normocephalic, atraumatic. Pupils equal round and reactive to light; sclera anicteric; extraocular muscles intact;  Nose without nasal septal hypertrophy Mouth/Parynx benign; Mallinpatti scale 4 Neck: No JVD, no carotid bruits; normal carotid upstroke Lungs: clear to ausculatation and percussion; no wheezing or rales Chest wall: without tenderness to palpitation Heart: PMI not displaced, RRR, s1 s2 normal, 1/6 systolic murmur along the left sternal border, no diastolic murmur, no rubs, gallops, thrills, or heaves Abdomen: soft, nontender; no hepatosplenomehaly, BS+; abdominal aorta nontender and  not dilated by palpation. Back: no CVA tenderness Pulses 2+ Musculoskeletal: full range of motion, normal strength, no joint deformities Extremities: no clubbing cyanosis or edema, Homan's sign negative  Neurologic: grossly nonfocal; Cranial nerves grossly wnl Psychologic: Normal mood and affect   Studies/Labs Reviewed:   EKG:  EKG is ordered today. ECG (independently read by me): NSR at 70, QS V1-2; normal intervals  Recent Labs: BMP Latest Ref Rng & Units 02/21/2021 01/28/2020 04/18/2018  Glucose 65 - 99 mg/dL 88 - -  BUN 8 - 27 mg/dL 19 16 -  Creatinine 0.57 - 1.00 mg/dL 1.24(H) 1.1 -  BUN/Creat Ratio 12 - 28 15 - -  Sodium 134 - 144 mmol/L 143 - -  Potassium 3.5 - 5.2 mmol/L 4.2 - -  Chloride 96 - 106 mmol/L 101 - -  CO2 20 - 29 mmol/L 22 - -  Calcium 8.7 - 10.3 mg/dL 10.7(H) - 9.8     Hepatic Function Latest Ref Rng & Units 04/18/2018 07/19/2017  Total Protein 6.0 - 8.3 g/dL 7.7 7.6  Albumin 3.5 - 5.2 g/dL 3.9 4.1  AST 0 - 37 U/L 20 19  ALT 0 - 35 U/L 13 13  Alk Phosphatase 39 - 117 U/L 143(H) 169(H)  Total Bilirubin 0.2 - 1.2 mg/dL 0.7 0.6  Bilirubin, Direct 0.0 - 0.3 mg/dL 0.1 0.2    CBC Latest Ref Rng & Units 02/21/2021  WBC 3.4 - 10.8 x10E3/uL 9.1  Hemoglobin 11.1 - 15.9 g/dL 13.8  Hematocrit 34.0 - 46.6 % 46.2  Platelets 150 - 450 x10E3/uL 276   Lab Results  Component Value Date   MCV 73 (L) 02/21/2021   Lab Results  Component Value Date   TSH 1.22 01/28/2020   Lab Results  Component Value Date   HGBA1C 6.8 01/28/2020     BNP No results found for: BNP  ProBNP No results found for: PROBNP   Lipid Panel  No results found for: CHOL, TRIG, HDL, CHOLHDL, VLDL, LDLCALC, LDLDIRECT, LABVLDL   RADIOLOGY: MR ABDOMEN WWO CONTRAST  Result Date: 02/07/2021 CLINICAL DATA:  Cystic renal lesions, follow-up evaluation in a 75 year old female. EXAM: MRI ABDOMEN WITHOUT AND WITH CONTRAST TECHNIQUE: Multiplanar multisequence MR imaging of the abdomen was performed  both before and after the administration of intravenous contrast. CONTRAST:  82m MULTIHANCE GADOBENATE DIMEGLUMINE 529 MG/ML IV SOLN COMPARISON:  Comparison is made with MRI of  the abdomen of October 17, 2020. FINDINGS: Lower chest: Incidental imaging of the lung bases is unremarkable with limited assessment on MRI. Hepatobiliary: Liver incompletely imaged on many of the submitted sequences due to this being a renal focused evaluation. No focal, suspicious hepatic lesion. Gallstone in the neck of the gallbladder measures up to 1 cm. No biliary duct dilation. No pericholecystic stranding. Pancreas: Only partially imaged on most sequences. Cystic lesion in the uncinate without signs of enhancement measuring 5 mm (image 14/7) also seen on image 16 of series 8. Mild prominence of the main pancreatic duct which is seen draining into the minor papilla. Spleen:  Incompletely imaged on most sequences.  No gross lesion. Adrenals/Urinary Tract:  Adrenal glands are normal. Cysts of varying complexity in the bilateral kidneys, some simple and some with proteinaceous or hemorrhagic component are largely similar to the prior study. RIGHT kidney: Exophytic cystic lesion arising from the lower pole the RIGHT kidney thin walled with the exception of the posterior margin where there is a small enhancing focus along the posterior margin (image 54/11) compatible with mural nodule measuring approximately 3 mm greatest thickness forming up obtuse margins with the wall and measuring 9 mm greatest axial dimension, unchanged compared to previous imaging. Lesion size as a whole at 5.6 cm x 5.9 cm is also unchanged. Other small and moderate size cystic lesions in the RIGHT kidney display no change with an area of adherent hemorrhagic debris along the medial margin of a cystic RIGHT renal lesion seen on image 36 of series 11 with no change in signal intensity. LEFT kidney: Stable appearance of many of the LEFT renal lesions, some hemorrhagic  and/or proteinaceous and some simple. Thick wall lesion without frank mural nodularity along the LEFT anterolateral upper-interpolar LEFT kidney measures approximately 4.1 x 3.2 cm, within 1 mm of previous measurement. Posterior wall without mural nodularity but with crescentic thickening along the inferior margin and question of cystic change within the wall with thickness potentially up to 7 mm as seen on image 29 of series 17. Otherwise uniformly thickened 2-3 mm wall is exhibited. Small lesion along the anterior aspect lower pole of the LEFT kidney showing some internal enhancement at 1.4 cm greatest axial dimension (image 38/15) No hydronephrosis. Stomach/Bowel: No acute gastrointestinal process to the extent evaluated. Vascular/Lymphatic: Patent abdominal vessels. No aneurysmal dilation. No adenopathy in the retroperitoneum. Other:  No ascites Musculoskeletal: No suspicious bone lesions identified. IMPRESSION: 1. Bosniak category III lesion arises from lower pole of the RIGHT kidney without change. 2. Previously described "Bosniak category II F" lesion along the lateral LEFT kidney/interpolar may be better characterized as a Bosniak category III lesion due to wall thickness. The possibility of focal post inflammatory changes are considered based on appearance. 3. Small enhancing lesion raising the question of small renal cell carcinoma along the anterior margin of the lower pole of the LEFT kidney. 4. Lesions above are stable. 5. Stable appearance otherwise of hemorrhagic and/or proteinaceous cysts elsewhere in the kidney. 6. Small cystic lesion in the uncinate process of the pancreas without signs of enhancement. Could consider dedicated follow-up in 2 years. There is also a question of divisum versus dominant dorsal drainage via minor papilla. 7. Cholelithiasis. Electronically Signed   By: Zetta Bills M.D.   On: 02/07/2021 12:43     Additional studies/ records that were reviewed today include:  I  personally reviewed the records of [facility], laboratory from the [facility], patient's BiPAP titration study by Dr. Jeneen Rinks  [place].  ECHO: 10/22/2020 IMPRESSIONS   1. Left ventricular ejection fraction, by estimation, is 55 to 60%. The  left ventricle has normal function. The left ventricle has no regional  wall motion abnormalities. Left ventricular diastolic parameters are  indeterminate.   2. Right ventricular systolic function is normal. The right ventricular  size is normal. There is mildly elevated pulmonary artery systolic  pressure.   3. Left atrial size was mildly dilated.   4. Right atrial size was mildly dilated.   5. The mitral valve is normal in structure. Trivial mitral valve  regurgitation. No evidence of mitral stenosis.   6. The aortic valve is tricuspid. There is mild calcification of the  aortic valve. Aortic valve regurgitation is not visualized. Mild aortic  valve sclerosis is present, with no evidence of aortic valve stenosis.   7. Possible shunting seen.   ASSESSMENT:    1. Evaluate for possible atrial shunt on echo   2. Essential hypertension   3. Pulmonary emphysema, unspecified emphysema type ([place])   4. Nocturnal hypoxemia   5. OSA (obstructive sleep apnea) on BiPAP   6. Hyperlipidemia with target LDL less than 70   7. Type 2 diabetes mellitus with complication, without long-term current use of insulin ([place])   8. Hypothyroidism, unspecified type   9. Pre-op testing      PLAN:  Ms. [facility] is a very pleasant 75 year old female who is originally from [place].  She has a previous 35-year history of tobacco use and fortunately quit 15 years ago.  She has evidence of pulmonary emphysema on CT imaging and has been demonstrated to have mild restrictive and diffusion defect on pulmonary function studies.  She has a longstanding history of obstructive sleep apnea for over 20 years and has had issues with recent significant oxygen desaturation on exertion.  She  has been demonstrated to have significant nocturnal hypoxemia and is now on BiPAP with supplemental oxygenation.  Her degree of hypoxemia on exertion seems out of proportion to her CT chest findings or PFT findings.  Her echo Doppler evaluation has confirmed normal systolic function without wall motion abnormalities.  Diastolic parameters were indeterminate.  Her right ventricle size and function are normal and she had evidence for mild pulmonary artery systolic pressure.  On her transthoracic echo, concern was raised about possible shunting but this could not be verified.  I have recommended that the patient undergo a transesophageal echo for further evaluation.  Presently, the patient denies any anginal symptomatology.  She is unaware of any history of atrial fibrillation.  She is on multiple drug regimen for hypertension including amlodipine 5 mg, propranolol ER 160 mg, quinapril 40 mg, in addition to triamterene/HCTZ 37.5/25 mg daily.  Her resting pulse today is 70.  I have recommended she change to a more cardioselective beta-blocker and as result we will discontinue propranolol ER and initiate metoprolol succinate for which she will take 50 mg for the first 4 days and then increase to 75 mg.  She is on levothyroxine for hypothyroidism.  She is diabetic on metformin.  I reviewed recent laboratory from [facility] done by Karen Leriche, PA which showed a stable hemoglobin hematocrit at 13.1 and 42.1 respectively.  Creatinine was 1.3 with BUN 19.  LFTs were normal.  TSH was 1.09.  Lipid studies are fairly stable with a total cholesterol 159, triglycerides 116, HDL 59, LDL 80.  She currently is on atorvastatin 10 mg.  With her diabetes mellitus she may benefit from  further titration to 20 mg with target LDL less than 70.  I will notify regarding her transesophageal echocardiographic evaluation and plan to see her in 3 months for follow-up assessment.    Medication Adjustments/Labs and Tests Ordered: Current  medicines are reviewed at length with the patient today.  Concerns regarding medicines are outlined above.  Medication changes, Labs and Tests ordered today are listed in the Patient Instructions below. Patient Instructions  Medication Instructions:   Stop taking propanolol 160 mg   Start taking  Metoprolol succinate 75 mg ( 1 and 1/2 tablet equal 75 mg  ) by daily - the first four days take 50 mg( one tablet)   Monitor pulse rate if stays below 60 contact office.  *If you need a refill on your cardiac medications before your next appointment, please call your pharmacy*   Lab Work: BMP CBC If you have labs (blood work) drawn today and your tests are completely normal, you will receive your results only by: [facility] (if you have MyChart) OR A paper copy in the mail If you have any lab test that is abnormal or we need to change your treatment, we will call you to review the results.   Testing/Procedures:  Your physician has requested that you have a TEE. During a TEE, sound waves are used to create images of your heart. It provides your doctor with information about the size and shape of your heart and how well your heart's chambers and valves are working. In this test, a transducer is attached to the end of a flexible tube that's guided down your throat and into your esophagus (the tube leading from you mouth to your stomach) to get a more detailed image of your heart. You are not awake for the procedure. Please see the instruction sheet given to you today. For further information please visit [email or web address].     Follow-Up: At [facility], you and your health needs are our priority.  As part of our continuing mission to provide you with exceptional heart care, we have created designated Provider Care Teams.  These Care Teams include your primary Cardiologist (physician) and Advanced Practice Providers (APPs -  Physician Assistants and Nurse Practitioners) who all work  together to provide you with the care you need, when you need it.  We recommend signing up for the patient portal called "MyChart".  Sign up information is provided on this After Visit Summary.  MyChart is used to connect with patients for Virtual Visits (Telemedicine).  Patients are able to view lab/test results, encounter notes, upcoming appointments, etc.  Non-urgent messages can be sent to your provider as well.   To learn more about what you can do with MyChart, go to [email or web address].    Your next appointment:   3 month(s)  The format for your next appointment:   In Person  Provider:   Shelva Majestic, MD   Other Instructions  Dear Ms Karen Black are scheduled for a TEE on  Aug 16,202 with Dr. Acie Fredrickson.  Please arrive at the [facility] (Main Entrance A) at [facility]: [address] [place], [place] [address] at 9 am   DIET: Nothing to eat or drink after midnight except a sip of water with medications (see medication instructions below)  FYI: For your safety, and to allow [place] to monitor your vital signs accurately during the surgery/procedure we request that   if you have artificial nails, gel coating, SNS etc. Please have  those removed prior to your surgery/procedure. Not having the nail coverings /polish removed may result in cancellation or delay of your surgery/procedure.   Medication Instructions:  Hold no medication the morning of procedure  Labs: BMP,CBC today 02/21/21    You must have a responsible person to drive you home and stay in the waiting area during your procedure. Failure to do so could result in cancellation.  Bring your insurance cards.  *Special Note: Every effort is made to have your procedure done on time. Occasionally there are emergencies that occur at the hospital that may cause delays. Please be patient if a delay does occur.     Signed, Karen Majestic, MD  02/28/2021 9:15 AM    [facility] [address], [place], [place], [place]  [address] Phone: [phone]

## 2021-02-21 NOTE — Progress Notes (Signed)
Cardiology Office Note    Date:  02/28/2021   ID:  Karen Black, DOB 1946-04-05, MRN [id number]  PCP:  Maude Leriche, PA-C  Cardiologist:  Shelva Majestic, MD   New cardiology evaluation referred through the courtesy of Dr. Freda Jackson in this patient with obstructive sleep apnea, with low oxygen saturation, and a recent transthoracic echo raising concern for possible shunting.   History of Present Illness:  Karen Black is a 75 y.o. female who has a history of prior tobacco use, diabetes mellitus, pulmonary emphysema on CT imaging along with mild restrictive and diffusion defects on pulmonary function testing and obstructive sleep apnea on CPAP therapy.  She is followed by [facility] primary care.  She was seen by Dr. Erin Fulling at [facility] pulmonary for low oxygen saturation.  A walk in his office in March 2022 showed an O2 desaturation to 77% with the first lap of walking and with rapid return to 95% with rest.  She was not started on supplemental oxygen at that time.  However in her April 2022 evaluation was recommended that she initiate supplemental oxygen at 2 to 3 L.  She had undergone a transthoracic echo Doppler study   on October 22, 2020 which showed an EF of 55 to 60% without wall motion abnormality.  She had normal RV function.  There was mildly elevated pulmonary artery systolic pressure, mild biatrial enlargement, as well as mild aortic valve sclerosis without stenosis.  There was concern for possible shunting across the atrial septum, not definitive. She has a history of obstructive sleep apnea for over 20 years and most recently had a follow-up sleep study by Dr. Nehemiah Settle for evaluation of persisting hypoxemia in the setting of well-controlled OSA on CPAP on December 27, 2020.  The overall AHI was 3.1/h and RDI 9.7/h with a central index of 0.3/h.  During titration apneas and hypopneas were abolished at lower pressures but the most appropriate setting control where was was with BiPAP at  24/20 cm of water.  The patient was treated with supplemental oxygen at 1 L.  Alternatively CPAP of 13-18 with oxygen was felt to be adequate with only a few hours.  It is felt that the patient's degree of hypoxemia on exertion does not seem to fit her CT chest findings or PFT findings.  As result, she is now referred for cardiology evaluation for further assessment.  She has a history of hypertension and has been on amlodipine 5 mg, extended propranolol 160 mg SR, quinapril 40 mg, and triamterene/HCTZ 37.5/25 mg daily.  She has been on Stiolto Respimat and albuterol for her lungs.  She has hypothyroidism on levothyroxine 75 mcg.  She has a history of hyperlipidemia and has been on low-dose atorvastatin at 10 mg.  She also is on metformin 500 mg daily and is followed by Dr. Loanne Drilling.  She denies any chest pain.  She is unaware of nocturnal arrhythmias.  She denies any awareness of atrial fibrillation.  She is now on supplemental oxygen with her BiPAP therapy.  She presents for cardiology evaluation.  Past Medical History:  Diagnosis Date   Diabetes ([place])    Dyslipidemia    Hypothyroidism    Sleep apnea     Surgical history is notable for hysterectomy  Current Medications: Outpatient Medications Prior to Visit  Medication Sig Dispense Refill   albuterol (VENTOLIN HFA) 108 (90 Base) MCG/ACT inhaler Inhale 2 puffs into the lungs every 6 (six) hours as needed for wheezing or shortness of  breath. 8 g 6   amLODipine (NORVASC) 5 MG tablet Take 5 mg by mouth daily.     atorvastatin (LIPITOR) 10 MG tablet Take 10 mg by mouth daily.     levothyroxine (SYNTHROID, LEVOTHROID) 75 MCG tablet Take 75 mcg by mouth daily before breakfast.     metFORMIN (GLUCOPHAGE) 500 MG tablet Take 500 mg by mouth 2 (two) times daily.     quinapril (ACCUPRIL) 40 MG tablet Take 40 mg by mouth 2 (two) times daily.     Tiotropium Bromide-Olodaterol (STIOLTO RESPIMAT) 2.5-2.5 MCG/ACT AERS Inhale 2 puffs into the lungs daily.  (Patient not taking: No sig reported) 12 g 2   Tiotropium Bromide-Olodaterol (STIOLTO RESPIMAT) 2.5-2.5 MCG/ACT AERS Inhale 2 puffs into the lungs daily. (Patient not taking: No sig reported) 4 g 0   triamterene-hydrochlorothiazide (MAXZIDE-25) 37.5-25 MG tablet Take 1 capsule by mouth daily.     cycloSPORINE (RESTASIS) 0.05 % ophthalmic emulsion 1 drop 2 (two) times daily. (Patient not taking: Reported on 02/24/2021)     propranolol ER (INDERAL LA) 160 MG SR capsule      No facility-administered medications prior to visit.     Allergies:   Patient has no known allergies.   Social History   Socioeconomic History   Marital status: Divorced    Spouse name: Not on file   Number of children: Not on file   Years of education: Not on file   Highest education level: Not on file  Occupational History   Not on file  Tobacco Use   Smoking status: Former   Smokeless tobacco: Never  Substance and Sexual Activity   Alcohol use: No   Drug use: Not on file   Sexual activity: Not on file  Other Topics Concern   Not on file  Social History Narrative   Not on file   Social Determinants of Health   Financial Resource Strain: Not on file  Food Insecurity: Not on file  Transportation Needs: Not on file  Physical Activity: Not on file  Stress: Not on file  Social Connections: Not on file    Socially she was born in [place], he has lived in [place] for 6 years.  She is divorced.  She has 1 daughter who who lives in the police department in [place].  Patient is a retired Optometrist.  She had smoked for 30 years but quit 15 years ago.  She does not routinely exercise.   Family History:  The patient's family history includes Breast cancer (age of onset: 68) in her sister.  Both parents are deceased, mother died at age 84, father had liver cancer and died at age 89.  She has 2 deceased brothers 33 age 37 who had dementia and other age 7 who had thyroid issues.  She has 1  deceased sister who died of cancer at 67.  ROS General: Negative; No fevers, chills, or night sweats; obesity HEENT: Negative; No changes in vision or hearing, sinus congestion, difficulty swallowing Pulmonary: Pulmonary emphysema Cardiovascular: Negative; No chest pain, presyncope, syncope, palpitations GI: Negative; No nausea, vomiting, diarrhea, or abdominal pain GU: Negative; No dysuria, hematuria, or difficulty voiding Musculoskeletal: Negative; no myalgias, joint pain, or weakness Hematologic/Oncology: Negative; no easy bruising, bleeding Endocrine: Negative; no heat/cold intolerance; no diabetes Neuro: Negative; no changes in balance, headaches Skin: Negative; No rashes or skin lesions Psychiatric: Negative; No behavioral problems, depression Sleep: OSA, currently on BiPAP with supplemental O2 Other comprehensive 14 point system review  is negative.   PHYSICAL EXAM:   VS:  BP 134/78 (BP Location: Left Arm, Patient Position: Sitting, Cuff Size: Normal)   Pulse 70   Ht _0  (1.651 m)   Wt 205 lb (93 kg)   SpO2 98%   BMI 34.11 kg/m     Repeat blood pressure by me 140/78  Wt Readings from Last 3 Encounters:  02/21/21 205 lb (93 kg)  12/27/20 210 lb (95.3 kg)  11/12/20 210 lb (95.3 kg)    General: Alert, oriented, no distress.  Skin: normal turgor, no rashes, warm and dry HEENT: Normocephalic, atraumatic. Pupils equal round and reactive to light; sclera anicteric; extraocular muscles intact;  Nose without nasal septal hypertrophy Mouth/Parynx benign; Mallinpatti scale 4 Neck: No JVD, no carotid bruits; normal carotid upstroke Lungs: clear to ausculatation and percussion; no wheezing or rales Chest wall: without tenderness to palpitation Heart: PMI not displaced, RRR, s1 s2 normal, 1/6 systolic murmur along the left sternal border, no diastolic murmur, no rubs, gallops, thrills, or heaves Abdomen: soft, nontender; no hepatosplenomehaly, BS+; abdominal aorta nontender and  not dilated by palpation. Back: no CVA tenderness Pulses 2+ Musculoskeletal: full range of motion, normal strength, no joint deformities Extremities: no clubbing cyanosis or edema, Homan's sign negative  Neurologic: grossly nonfocal; Cranial nerves grossly wnl Psychologic: Normal mood and affect   Studies/Labs Reviewed:   EKG:  EKG is ordered today. ECG (independently read by me): NSR at 70, QS V1-2; normal intervals  Recent Labs: BMP Latest Ref Rng & Units 02/21/2021 01/28/2020 04/18/2018  Glucose 65 - 99 mg/dL 88 - -  BUN 8 - 27 mg/dL 19 16 -  Creatinine 0.57 - 1.00 mg/dL 1.24(H) 1.1 -  BUN/Creat Ratio 12 - 28 15 - -  Sodium 134 - 144 mmol/L 143 - -  Potassium 3.5 - 5.2 mmol/L 4.2 - -  Chloride 96 - 106 mmol/L 101 - -  CO2 20 - 29 mmol/L 22 - -  Calcium 8.7 - 10.3 mg/dL 10.7(H) - 9.8     Hepatic Function Latest Ref Rng & Units 04/18/2018 07/19/2017  Total Protein 6.0 - 8.3 g/dL 7.7 7.6  Albumin 3.5 - 5.2 g/dL 3.9 4.1  AST 0 - 37 U/L 20 19  ALT 0 - 35 U/L 13 13  Alk Phosphatase 39 - 117 U/L 143(H) 169(H)  Total Bilirubin 0.2 - 1.2 mg/dL 0.7 0.6  Bilirubin, Direct 0.0 - 0.3 mg/dL 0.1 0.2    CBC Latest Ref Rng & Units 02/21/2021  WBC 3.4 - 10.8 x10E3/uL 9.1  Hemoglobin 11.1 - 15.9 g/dL 13.8  Hematocrit 34.0 - 46.6 % 46.2  Platelets 150 - 450 x10E3/uL 276   Lab Results  Component Value Date   MCV 73 (L) 02/21/2021   Lab Results  Component Value Date   TSH 1.22 01/28/2020   Lab Results  Component Value Date   HGBA1C 6.8 01/28/2020     BNP No results found for: BNP  ProBNP No results found for: PROBNP   Lipid Panel  No results found for: CHOL, TRIG, HDL, CHOLHDL, VLDL, LDLCALC, LDLDIRECT, LABVLDL   RADIOLOGY: MR ABDOMEN WWO CONTRAST  Result Date: 02/07/2021 CLINICAL DATA:  Cystic renal lesions, follow-up evaluation in a 75 year old female. EXAM: MRI ABDOMEN WITHOUT AND WITH CONTRAST TECHNIQUE: Multiplanar multisequence MR imaging of the abdomen was performed  both before and after the administration of intravenous contrast. CONTRAST:  82m MULTIHANCE GADOBENATE DIMEGLUMINE 529 MG/ML IV SOLN COMPARISON:  Comparison is made with MRI of  the abdomen of October 17, 2020. FINDINGS: Lower chest: Incidental imaging of the lung bases is unremarkable with limited assessment on MRI. Hepatobiliary: Liver incompletely imaged on many of the submitted sequences due to this being a renal focused evaluation. No focal, suspicious hepatic lesion. Gallstone in the neck of the gallbladder measures up to 1 cm. No biliary duct dilation. No pericholecystic stranding. Pancreas: Only partially imaged on most sequences. Cystic lesion in the uncinate without signs of enhancement measuring 5 mm (image 14/7) also seen on image 16 of series 8. Mild prominence of the main pancreatic duct which is seen draining into the minor papilla. Spleen:  Incompletely imaged on most sequences.  No gross lesion. Adrenals/Urinary Tract:  Adrenal glands are normal. Cysts of varying complexity in the bilateral kidneys, some simple and some with proteinaceous or hemorrhagic component are largely similar to the prior study. RIGHT kidney: Exophytic cystic lesion arising from the lower pole the RIGHT kidney thin walled with the exception of the posterior margin where there is a small enhancing focus along the posterior margin (image 54/11) compatible with mural nodule measuring approximately 3 mm greatest thickness forming up obtuse margins with the wall and measuring 9 mm greatest axial dimension, unchanged compared to previous imaging. Lesion size as a whole at 5.6 cm x 5.9 cm is also unchanged. Other small and moderate size cystic lesions in the RIGHT kidney display no change with an area of adherent hemorrhagic debris along the medial margin of a cystic RIGHT renal lesion seen on image 36 of series 11 with no change in signal intensity. LEFT kidney: Stable appearance of many of the LEFT renal lesions, some hemorrhagic  and/or proteinaceous and some simple. Thick wall lesion without frank mural nodularity along the LEFT anterolateral upper-interpolar LEFT kidney measures approximately 4.1 x 3.2 cm, within 1 mm of previous measurement. Posterior wall without mural nodularity but with crescentic thickening along the inferior margin and question of cystic change within the wall with thickness potentially up to 7 mm as seen on image 29 of series 17. Otherwise uniformly thickened 2-3 mm wall is exhibited. Small lesion along the anterior aspect lower pole of the LEFT kidney showing some internal enhancement at 1.4 cm greatest axial dimension (image 38/15) No hydronephrosis. Stomach/Bowel: No acute gastrointestinal process to the extent evaluated. Vascular/Lymphatic: Patent abdominal vessels. No aneurysmal dilation. No adenopathy in the retroperitoneum. Other:  No ascites Musculoskeletal: No suspicious bone lesions identified. IMPRESSION: 1. Bosniak category III lesion arises from lower pole of the RIGHT kidney without change. 2. Previously described "Bosniak category II F" lesion along the lateral LEFT kidney/interpolar may be better characterized as a Bosniak category III lesion due to wall thickness. The possibility of focal post inflammatory changes are considered based on appearance. 3. Small enhancing lesion raising the question of small renal cell carcinoma along the anterior margin of the lower pole of the LEFT kidney. 4. Lesions above are stable. 5. Stable appearance otherwise of hemorrhagic and/or proteinaceous cysts elsewhere in the kidney. 6. Small cystic lesion in the uncinate process of the pancreas without signs of enhancement. Could consider dedicated follow-up in 2 years. There is also a question of divisum versus dominant dorsal drainage via minor papilla. 7. Cholelithiasis. Electronically Signed   By: Zetta Bills M.D.   On: 02/07/2021 12:43     Additional studies/ records that were reviewed today include:  I  personally reviewed the records of [facility], laboratory from the [facility], patient's BiPAP titration study by Dr. Jeneen Rinks  [place].  ECHO: 10/22/2020 IMPRESSIONS   1. Left ventricular ejection fraction, by estimation, is 55 to 60%. The  left ventricle has normal function. The left ventricle has no regional  wall motion abnormalities. Left ventricular diastolic parameters are  indeterminate.   2. Right ventricular systolic function is normal. The right ventricular  size is normal. There is mildly elevated pulmonary artery systolic  pressure.   3. Left atrial size was mildly dilated.   4. Right atrial size was mildly dilated.   5. The mitral valve is normal in structure. Trivial mitral valve  regurgitation. No evidence of mitral stenosis.   6. The aortic valve is tricuspid. There is mild calcification of the  aortic valve. Aortic valve regurgitation is not visualized. Mild aortic  valve sclerosis is present, with no evidence of aortic valve stenosis.   7. Possible shunting seen.   ASSESSMENT:    1. Evaluate for possible atrial shunt on echo   2. Essential hypertension   3. Pulmonary emphysema, unspecified emphysema type ([place])   4. Nocturnal hypoxemia   5. OSA (obstructive sleep apnea) on BiPAP   6. Hyperlipidemia with target LDL less than 70   7. Type 2 diabetes mellitus with complication, without long-term current use of insulin ([place])   8. Hypothyroidism, unspecified type   9. Pre-op testing      PLAN:  Ms. Terrilee Dudzik is a very pleasant 75 year old female who is originally from [place].  She has a previous 35-year history of tobacco use and fortunately quit 15 years ago.  She has evidence of pulmonary emphysema on CT imaging and has been demonstrated to have mild restrictive and diffusion defect on pulmonary function studies.  She has a longstanding history of obstructive sleep apnea for over 20 years and has had issues with recent significant oxygen desaturation on exertion.  She  has been demonstrated to have significant nocturnal hypoxemia and is now on BiPAP with supplemental oxygenation.  Her degree of hypoxemia on exertion seems out of proportion to her CT chest findings or PFT findings.  Her echo Doppler evaluation has confirmed normal systolic function without wall motion abnormalities.  Diastolic parameters were indeterminate.  Her right ventricle size and function are normal and she had evidence for mild pulmonary artery systolic pressure.  On her transthoracic echo, concern was raised about possible shunting but this could not be verified.  I have recommended that the patient undergo a transesophageal echo for further evaluation.  Presently, the patient denies any anginal symptomatology.  She is unaware of any history of atrial fibrillation.  She is on multiple drug regimen for hypertension including amlodipine 5 mg, propranolol ER 160 mg, quinapril 40 mg, in addition to triamterene/HCTZ 37.5/25 mg daily.  Her resting pulse today is 70.  I have recommended she change to a more cardioselective beta-blocker and as result we will discontinue propranolol ER and initiate metoprolol succinate for which she will take 50 mg for the first 4 days and then increase to 75 mg.  She is on levothyroxine for hypothyroidism.  She is diabetic on metformin.  I reviewed recent laboratory from [facility] done by Maude Leriche, PA which showed a stable hemoglobin hematocrit at 13.1 and 42.1 respectively.  Creatinine was 1.3 with BUN 19.  LFTs were normal.  TSH was 1.09.  Lipid studies are fairly stable with a total cholesterol 159, triglycerides 116, HDL 59, LDL 80.  She currently is on atorvastatin 10 mg.  With her diabetes mellitus she may benefit from  further titration to 20 mg with target LDL less than 70.  I will notify regarding her transesophageal echocardiographic evaluation and plan to see her in 3 months for follow-up assessment.    Medication Adjustments/Labs and Tests Ordered: Current  medicines are reviewed at length with the patient today.  Concerns regarding medicines are outlined above.  Medication changes, Labs and Tests ordered today are listed in the Patient Instructions below. Patient Instructions  Medication Instructions:   Stop taking propanolol 160 mg   Start taking  Metoprolol succinate 75 mg ( 1 and 1/2 tablet equal 75 mg  ) by daily - the first four days take 50 mg( one tablet)   Monitor pulse rate if stays below 60 contact office.  *If you need a refill on your cardiac medications before your next appointment, please call your pharmacy*   Lab Work: BMP CBC If you have labs (blood work) drawn today and your tests are completely normal, you will receive your results only by: [facility] (if you have MyChart) OR A paper copy in the mail If you have any lab test that is abnormal or we need to change your treatment, we will call you to review the results.   Testing/Procedures:  Your physician has requested that you have a TEE. During a TEE, sound waves are used to create images of your heart. It provides your doctor with information about the size and shape of your heart and how well your heart's chambers and valves are working. In this test, a transducer is attached to the end of a flexible tube that's guided down your throat and into your esophagus (the tube leading from you mouth to your stomach) to get a more detailed image of your heart. You are not awake for the procedure. Please see the instruction sheet given to you today. For further information please visit [email or web address].     Follow-Up: At [facility], you and your health needs are our priority.  As part of our continuing mission to provide you with exceptional heart care, we have created designated Provider Care Teams.  These Care Teams include your primary Cardiologist (physician) and Advanced Practice Providers (APPs -  Physician Assistants and Nurse Practitioners) who all work  together to provide you with the care you need, when you need it.  We recommend signing up for the patient portal called "MyChart".  Sign up information is provided on this After Visit Summary.  MyChart is used to connect with patients for Virtual Visits (Telemedicine).  Patients are able to view lab/test results, encounter notes, upcoming appointments, etc.  Non-urgent messages can be sent to your provider as well.   To learn more about what you can do with MyChart, go to [email or web address].    Your next appointment:   3 month(s)  The format for your next appointment:   In Person  Provider:   Shelva Majestic, MD   Other Instructions  Dear Ms Meesha Sek are scheduled for a TEE on  Aug 16,202 with Dr. Acie Fredrickson.  Please arrive at the [facility] (Main Entrance A) at [facility]: [address] [place], [place] [address] at 9 am   DIET: Nothing to eat or drink after midnight except a sip of water with medications (see medication instructions below)  FYI: For your safety, and to allow [place] to monitor your vital signs accurately during the surgery/procedure we request that   if you have artificial nails, gel coating, SNS etc. Please have  those removed prior to your surgery/procedure. Not having the nail coverings /polish removed may result in cancellation or delay of your surgery/procedure.   Medication Instructions:  Hold no medication the morning of procedure  Labs: BMP,CBC today 02/21/21    You must have a responsible person to drive you home and stay in the waiting area during your procedure. Failure to do so could result in cancellation.  Bring your insurance cards.  *Special Note: Every effort is made to have your procedure done on time. Occasionally there are emergencies that occur at the hospital that may cause delays. Please be patient if a delay does occur.     Signed, Shelva Majestic, MD  02/28/2021 9:15 AM    [facility] [address], [place], [place], [place]  [address] Phone: [phone]

## 2021-02-22 LAB — BASIC METABOLIC PANEL
BUN/Creatinine Ratio: 15 (ref 12–28)
BUN: 19 mg/dL (ref 8–27)
CO2: 22 mmol/L (ref 20–29)
Calcium: 10.7 mg/dL — ABNORMAL HIGH (ref 8.7–10.3)
Chloride: 101 mmol/L (ref 96–106)
Creatinine, Ser: 1.24 mg/dL — ABNORMAL HIGH (ref 0.57–1.00)
Glucose: 88 mg/dL (ref 65–99)
Potassium: 4.2 mmol/L (ref 3.5–5.2)
Sodium: 143 mmol/L (ref 134–144)
eGFR: 45 mL/min/{1.73_m2} — ABNORMAL LOW (ref >59)

## 2021-02-22 LAB — CBC
Hematocrit: 46.2 % (ref 34.0–46.6)
Hemoglobin: 13.8 g/dL (ref 11.1–15.9)
MCH: 21.9 pg — ABNORMAL LOW (ref 26.6–33.0)
MCHC: 29.9 g/dL — ABNORMAL LOW (ref 31.5–35.7)
MCV: 73 fL — ABNORMAL LOW (ref 79–97)
Platelets: 276 10*3/uL (ref 150–450)
RBC: 6.3 x10E6/uL — ABNORMAL HIGH (ref 3.77–5.28)
RDW: 14.9 % (ref 11.7–15.4)
WBC: 9.1 10*3/uL (ref 3.4–10.8)

## 2021-02-28 ENCOUNTER — Encounter: Payer: Self-pay | Admitting: Cardiovascular Disease

## 2021-03-01 ENCOUNTER — Ambulatory Visit (HOSPITAL_BASED_OUTPATIENT_CLINIC_OR_DEPARTMENT_OTHER)
Admission: RE | Admit: 2021-03-01 | Discharge: 2021-03-01 | Disposition: A | Discharge status: Home / Self Care | Payer: Medicare Other | Source: Ambulatory Visit | Attending: Cardiovascular Disease | Admitting: Cardiovascular Disease

## 2021-03-01 ENCOUNTER — Other Ambulatory Visit: Payer: Self-pay

## 2021-03-01 ENCOUNTER — Encounter (HOSPITAL_COMMUNITY): Payer: Self-pay | Admitting: Cardiovascular Disease

## 2021-03-01 ENCOUNTER — Ambulatory Visit (HOSPITAL_COMMUNITY): Payer: Medicare Other | Admitting: Certified Registered Nurse Anesthetist

## 2021-03-01 ENCOUNTER — Encounter (HOSPITAL_COMMUNITY)
Admission: RE | Disposition: A | Discharge status: Home / Self Care | Payer: Self-pay | Source: Home / Self Care | Attending: Cardiovascular Disease

## 2021-03-01 ENCOUNTER — Ambulatory Visit (HOSPITAL_COMMUNITY)
Admission: RE | Admit: 2021-03-01 | Discharge: 2021-03-01 | Disposition: A | Discharge status: Home / Self Care | Payer: Medicare Other | Attending: Cardiovascular Disease | Admitting: Cardiovascular Disease

## 2021-03-01 DIAGNOSIS — I10 Essential (primary) hypertension: Secondary | ICD-10-CM | POA: Diagnosis not present

## 2021-03-01 DIAGNOSIS — I340 Nonrheumatic mitral (valve) insufficiency: Secondary | ICD-10-CM

## 2021-03-01 DIAGNOSIS — R931 Abnormal findings on diagnostic imaging of heart and coronary circulation: Secondary | ICD-10-CM

## 2021-03-01 DIAGNOSIS — G4733 Obstructive sleep apnea (adult) (pediatric): Secondary | ICD-10-CM | POA: Diagnosis not present

## 2021-03-01 DIAGNOSIS — E039 Hypothyroidism, unspecified: Secondary | ICD-10-CM | POA: Diagnosis not present

## 2021-03-01 DIAGNOSIS — Z01818 Encounter for other preprocedural examination: Secondary | ICD-10-CM

## 2021-03-01 DIAGNOSIS — J439 Emphysema, unspecified: Secondary | ICD-10-CM | POA: Insufficient documentation

## 2021-03-01 DIAGNOSIS — Z7984 Long term (current) use of oral hypoglycemic drugs: Secondary | ICD-10-CM | POA: Insufficient documentation

## 2021-03-01 DIAGNOSIS — Z79899 Other long term (current) drug therapy: Secondary | ICD-10-CM | POA: Diagnosis not present

## 2021-03-01 DIAGNOSIS — Z87891 Personal history of nicotine dependence: Secondary | ICD-10-CM | POA: Diagnosis not present

## 2021-03-01 DIAGNOSIS — E785 Hyperlipidemia, unspecified: Secondary | ICD-10-CM | POA: Insufficient documentation

## 2021-03-01 DIAGNOSIS — R0902 Hypoxemia: Secondary | ICD-10-CM | POA: Diagnosis not present

## 2021-03-01 DIAGNOSIS — Z79890 Hormone replacement therapy (postmenopausal): Secondary | ICD-10-CM | POA: Insufficient documentation

## 2021-03-01 DIAGNOSIS — I313 Pericardial effusion (noninflammatory): Secondary | ICD-10-CM | POA: Diagnosis not present

## 2021-03-01 DIAGNOSIS — E119 Type 2 diabetes mellitus without complications: Secondary | ICD-10-CM | POA: Diagnosis not present

## 2021-03-01 HISTORY — PX: BUBBLE STUDY: SHX6837

## 2021-03-01 HISTORY — PX: TEE WITHOUT CARDIOVERSION: SHX5443

## 2021-03-01 SURGERY — ECHOCARDIOGRAM, TRANSESOPHAGEAL
Anesthesia: Monitor Anesthesia Care

## 2021-03-01 MED ORDER — PROPOFOL 10 MG/ML IV BOLUS
INTRAVENOUS | Status: DC | PRN
  Administered 2021-03-01: 20 mg via INTRAVENOUS

## 2021-03-01 MED ORDER — LIDOCAINE 2% (20 MG/ML) 5 ML SYRINGE
INTRAMUSCULAR | Status: DC | PRN
  Administered 2021-03-01: 80 mg via INTRAVENOUS

## 2021-03-01 MED ORDER — PROPOFOL 500 MG/50ML IV EMUL
INTRAVENOUS | Status: DC | PRN
  Administered 2021-03-01: 125 ug/kg/min via INTRAVENOUS

## 2021-03-01 MED ORDER — SODIUM CHLORIDE 0.9 % IV SOLN: INTRAVENOUS | Status: DC

## 2021-03-01 NOTE — Anesthesia Procedure Notes (Signed)
Procedure Name: MAC Date/Time: 03/01/2021 10:11 AM Performed by: Dorthea Cove, CRNA Pre-anesthesia Checklist: Patient identified, Emergency Drugs available, Suction available, Patient being monitored and Timeout performed Patient Re-evaluated:Patient Re-evaluated prior to induction Oxygen Delivery Method: Nasal cannula Preoxygenation: Pre-oxygenation with 100% oxygen Induction Type: IV induction Placement Confirmation: positive ETCO2 and CO2 detector Dental Injury: Teeth and Oropharynx as per pre-operative assessment

## 2021-03-01 NOTE — Progress Notes (Signed)
  Echocardiogram Echocardiogram Transesophageal has been performed.  Karen Black 03/01/2021, 10:35 AM

## 2021-03-01 NOTE — Transfer of Care (Signed)
Immediate Anesthesia Transfer of Care Note  Patient: Karen Black  Procedure(s) Performed: TRANSESOPHAGEAL ECHOCARDIOGRAM (TEE) BUBBLE STUDY  Patient Location: Endoscopy Unit  Anesthesia Type:MAC  Level of Consciousness: awake, alert  and oriented  Airway & Oxygen Therapy: Patient Spontanous Breathing  Post-op Assessment: Report given to RN and Post -op Vital signs reviewed and stable  Post vital signs: Reviewed and stable  Last Vitals:  Vitals Value Taken Time  BP 116/61 03/01/21 1024  Temp    Pulse 64 03/01/21 1024  Resp 26 03/01/21 1024  SpO2 90 % 03/01/21 1024  Vitals shown include unvalidated device data.  Last Pain:  Vitals:   03/01/21 0850  TempSrc: Temporal  PainSc: 0-No pain         Complications: No notable events documented.

## 2021-03-01 NOTE — Anesthesia Preprocedure Evaluation (Signed)
Anesthesia Evaluation  Patient identified by MRN, date of birth, ID band Patient awake    Reviewed: Allergy & Precautions, NPO status , Patient's Chart, lab work & pertinent test results  Airway Mallampati: II  TM Distance: >3 FB Neck ROM: Full    Dental no notable dental hx.    Pulmonary sleep apnea , former smoker,    Pulmonary exam normal breath sounds clear to auscultation       Cardiovascular negative cardio ROS Normal cardiovascular exam Rhythm:Regular Rate:Normal     Neuro/Psych negative neurological ROS  negative psych ROS   GI/Hepatic negative GI ROS, Neg liver ROS,   Endo/Other  diabetesHypothyroidism   Renal/GU negative Renal ROS  negative genitourinary   Musculoskeletal negative musculoskeletal ROS (+)   Abdominal   Peds negative pediatric ROS (+)  Hematology negative hematology ROS (+)   Anesthesia Other Findings   Reproductive/Obstetrics negative OB ROS                             Anesthesia Physical Anesthesia Plan  ASA: 3  Anesthesia Plan: MAC   Post-op Pain Management:    Induction: Intravenous  PONV Risk Score and Plan: 2 and Treatment may vary due to age or medical condition  Airway Management Planned: Simple Face Mask  Additional Equipment:   Intra-op Plan:   Post-operative Plan:   Informed Consent: I have reviewed the patients History and Physical, chart, labs and discussed the procedure including the risks, benefits and alternatives for the proposed anesthesia with the patient or authorized representative who has indicated his/her understanding and acceptance.     Dental advisory given  Plan Discussed with: CRNA and Surgeon  Anesthesia Plan Comments:         Anesthesia Quick Evaluation

## 2021-03-01 NOTE — Anesthesia Postprocedure Evaluation (Signed)
Anesthesia Post Note  Patient: Karen Black  Procedure(s) Performed: TRANSESOPHAGEAL ECHOCARDIOGRAM (TEE) BUBBLE STUDY     Patient location during evaluation: PACU Anesthesia Type: MAC Level of consciousness: awake and alert Pain management: pain level controlled Vital Signs Assessment: post-procedure vital signs reviewed and stable Respiratory status: spontaneous breathing, nonlabored ventilation, respiratory function stable and patient connected to nasal cannula oxygen Cardiovascular status: stable and blood pressure returned to baseline Postop Assessment: no apparent nausea or vomiting Anesthetic complications: no   No notable events documented.  Last Vitals:  Vitals:   03/01/21 0850 03/01/21 1025  BP: (!) 173/79 116/61  Pulse: 69 64  Resp: (!) 22 (!) 26  Temp: (!) 36.4 C (!) 36.2 C  SpO2: 95% 90%    Last Pain:  Vitals:   03/01/21 1025  TempSrc: Temporal  PainSc: 0-No pain                 [name] S

## 2021-03-01 NOTE — Interval H&P Note (Signed)
History and Physical Interval Note:  03/01/2021 9:22 AM  Karen Black  has presented today for surgery, with the diagnosis of ABNORMAL ECHO,POSSIBLE SHUNT.  The various methods of treatment have been discussed with the patient and family. After consideration of risks, benefits and other options for treatment, the patient has consented to  Procedure(s): TRANSESOPHAGEAL ECHOCARDIOGRAM (TEE) (N/A) as a surgical intervention.  The patient's history has been reviewed, patient examined, no change in status, stable for surgery.  I have reviewed the patient's chart and labs.  Questions were answered to the patient's satisfaction.     Mertie Moores

## 2021-03-01 NOTE — CV Procedure (Signed)
    Transesophageal Echocardiogram Note  Shadae Sensing ID:[id number] 04-Jun-1946  Procedure: Transesophageal Echocardiogram Indications:  hypoxemia   Procedure Details Consent: Obtained Time Out: Verified patient identification, verified procedure, site/side was marked, verified correct patient position, special equipment/implants available, Radiology Safety Procedures followed,  medications/allergies/relevent history reviewed, required imaging and test results available.  Performed  Medications:  During this procedure the patient is administered Lidocaine 100 mg IV followed by propofol drip 120 mg IV .  The patient's heart rate, blood pressure, and oxygen saturation are monitored continuously during the procedure. The period of conscious sedation is 30  minutes, of which I was present face-to-face 100% of this time.  Left Ventrical:  normal LV function   Mitral Valve: trace - mild MR   Aortic Valve: 3 leaflet valve, no AS or AI   Tricuspid Valve: trace TR   Pulmonic Valve: normal , no PI   Left Atrium/ Left atrial appendage: large, no thrombi   Atrial septum: very small PFO with a small amount of R to L shunting by bubble study ( 1-2 bubbles every 3-4 heart beats )   Aorta: normal    Complications: No apparent complications Patient did tolerate procedure well.   Thayer Headings, Brooke Bonito., MD, [facility] 03/01/2021, 10:22 AM

## 2021-03-02 ENCOUNTER — Encounter (HOSPITAL_COMMUNITY): Payer: Self-pay | Admitting: Cardiovascular Disease

## 2021-03-11 ENCOUNTER — Ambulatory Visit (INDEPENDENT_AMBULATORY_CARE_PROVIDER_SITE_OTHER): Payer: Medicare Other | Admitting: Endocrinology

## 2021-03-11 ENCOUNTER — Other Ambulatory Visit: Payer: Self-pay

## 2021-03-11 VITALS — BP 194/90 | HR 78 | Ht 63.0 in | Wt 206.0 lb

## 2021-03-11 DIAGNOSIS — Z780 Asymptomatic menopausal state: Secondary | ICD-10-CM

## 2021-03-11 DIAGNOSIS — M889 Osteitis deformans of unspecified bone: Secondary | ICD-10-CM | POA: Diagnosis not present

## 2021-03-11 LAB — VITAMIN D 25 HYDROXY (VIT D DEFICIENCY, FRACTURES): VITD: 42.53 ng/mL (ref 30.00–100.00)

## 2021-03-11 LAB — ALKALINE PHOSPHATASE: Alkaline Phosphatase: 164 U/L — ABNORMAL HIGH (ref 39–117)

## 2021-03-11 NOTE — Patient Instructions (Addendum)
Let's recheck the bone density.  you will receive a phone call, about a day and time for an appointment. Blood tests are requested for you today.  We'll let you know about the results.   Please come back for a follow-up appointment in 1 year.

## 2021-03-11 NOTE — Progress Notes (Signed)
Subjective:    Patient ID: Karen Black, female    DOB: [birth year], 75 y.o.   MRN: [id number]  HPI Pt returns for f/u of Paget's Dz (dx'ed 2018, when AP was high; she has never been on rx for this; isoenzymes indicated bone cause, but Vit-D, PTH, and creat were normal; bone scan was abnormal only at the left shoulder; she has never had bony fx; plain films showed Paget's; hepatic US showed only NASH; she also had right shoulder and left knee pain, but x-rays did not show Paget's).  No new sxs.  In particular, denies left shoulder pain. She requests DEXA for screening Past Medical History:  Diagnosis Date   Diabetes ([place])    Dyslipidemia    Hypothyroidism    Sleep apnea     Past Surgical History:  Procedure Laterality Date   BUBBLE STUDY  03/01/2021   Procedure: BUBBLE STUDY;  Surgeon: Thayer Headings, MD;  Location: [facility];  Service: Cardiovascular;;   TEE WITHOUT CARDIOVERSION N/A 03/01/2021   Procedure: TRANSESOPHAGEAL ECHOCARDIOGRAM (TEE);  Surgeon: Acie Fredrickson, Wonda Cheng, MD;  Location: [facility] ENDOSCOPY;  Service: Cardiovascular;  Laterality: N/A;    Social History   Socioeconomic History   Marital status: Divorced    Spouse name: Not on file   Number of children: Not on file   Years of education: Not on file   Highest education level: Not on file  Occupational History   Not on file  Tobacco Use   Smoking status: Former   Smokeless tobacco: Never  Substance and Sexual Activity   Alcohol use: No   Drug use: Not on file   Sexual activity: Not on file  Other Topics Concern   Not on file  Social History Narrative   Not on file   Social Determinants of Health   Financial Resource Strain: Not on file  Food Insecurity: Not on file  Transportation Needs: Not on file  Physical Activity: Not on file  Stress: Not on file  Social Connections: Not on file  Intimate Partner Violence: Not on file    Current Outpatient Medications on File Prior to Visit  Medication Sig  Dispense Refill   albuterol (VENTOLIN HFA) 108 (90 Base) MCG/ACT inhaler Inhale 2 puffs into the lungs every 6 (six) hours as needed for wheezing or shortness of breath. 8 g 6   amLODipine (NORVASC) 5 MG tablet Take 5 mg by mouth daily.     atorvastatin (LIPITOR) 10 MG tablet Take 10 mg by mouth daily.     levothyroxine (SYNTHROID, LEVOTHROID) 75 MCG tablet Take 75 mcg by mouth daily before breakfast.     metFORMIN (GLUCOPHAGE) 500 MG tablet Take 500 mg by mouth 2 (two) times daily.     metoprolol succinate (TOPROL-XL) 50 MG 24 hr tablet Take 1.5 tablets (75 mg total) by mouth daily. 135 tablet 3   propranolol ER (INDERAL LA) 160 MG SR capsule Take 160 mg by mouth daily.     quinapril (ACCUPRIL) 40 MG tablet Take 40 mg by mouth 2 (two) times daily.     Tiotropium Bromide-Olodaterol (STIOLTO RESPIMAT) 2.5-2.5 MCG/ACT AERS Inhale 2 puffs into the lungs daily. 12 g 2   Tiotropium Bromide-Olodaterol (STIOLTO RESPIMAT) 2.5-2.5 MCG/ACT AERS Inhale 2 puffs into the lungs daily. 4 g 0   triamterene-hydrochlorothiazide (MAXZIDE-25) 37.5-25 MG tablet Take 1 capsule by mouth daily.     No current facility-administered medications on file prior to visit.    No Known Allergies  Family  History  Problem Relation Age of Onset   Breast cancer Sister 17    BP (!) 194/90 (BP Location: Right Arm, Patient Position: Sitting, Cuff Size: Normal)   Pulse 78   Ht '5\' 3"'$  (1.6 m)   Wt 206 lb (93.4 kg)   SpO2 92%   BMI 36.49 kg/m    Review of Systems     Objective:   Physical Exam VITAL SIGNS:  See vs page GENERAL: no distress Left shoulder: full ROM without pain.    25-OH Vit-D=42    Assessment & Plan:  Paget's dz, due for recheck.  Menopausal state: check DEXA, at pt's request.    Patient Instructions  Let's recheck the bone density.  you will receive a phone call, about a day and time for an appointment. Blood tests are requested for you today.  We'll let you know about the results.   Please  come back for a follow-up appointment in 1 year.

## 2021-03-14 ENCOUNTER — Telehealth: Payer: Self-pay | Admitting: Endocrinology

## 2021-03-14 DIAGNOSIS — J9611 Chronic respiratory failure with hypoxia: Secondary | ICD-10-CM | POA: Diagnosis not present

## 2021-03-14 NOTE — Telephone Encounter (Signed)
Patient recieved her test results via mychart and was wanting a call back regarding her results

## 2021-03-15 NOTE — Telephone Encounter (Signed)
Message sent thru MyChart 

## 2021-03-18 LAB — ALKALINE PHOSPHATASE, BONE SPECIFIC: ALKALINE PHOSPHATASE, BONE SPECIFIC: 49.6 mcg/L — ABNORMAL HIGH (ref 5.6–29.0)

## 2021-03-30 DIAGNOSIS — E1122 Type 2 diabetes mellitus with diabetic chronic kidney disease: Secondary | ICD-10-CM | POA: Diagnosis not present

## 2021-03-30 DIAGNOSIS — G4733 Obstructive sleep apnea (adult) (pediatric): Secondary | ICD-10-CM | POA: Diagnosis not present

## 2021-03-30 DIAGNOSIS — Z7984 Long term (current) use of oral hypoglycemic drugs: Secondary | ICD-10-CM | POA: Diagnosis not present

## 2021-03-30 DIAGNOSIS — J439 Emphysema, unspecified: Secondary | ICD-10-CM | POA: Diagnosis not present

## 2021-03-30 DIAGNOSIS — J9611 Chronic respiratory failure with hypoxia: Secondary | ICD-10-CM | POA: Diagnosis not present

## 2021-03-30 DIAGNOSIS — I10 Essential (primary) hypertension: Secondary | ICD-10-CM | POA: Diagnosis not present

## 2021-03-30 DIAGNOSIS — Z9981 Dependence on supplemental oxygen: Secondary | ICD-10-CM | POA: Diagnosis not present

## 2021-04-05 DIAGNOSIS — H0102B Squamous blepharitis left eye, upper and lower eyelids: Secondary | ICD-10-CM | POA: Diagnosis not present

## 2021-04-05 DIAGNOSIS — H0520 Unspecified exophthalmos: Secondary | ICD-10-CM | POA: Diagnosis not present

## 2021-04-05 DIAGNOSIS — Z961 Presence of intraocular lens: Secondary | ICD-10-CM | POA: Diagnosis not present

## 2021-04-05 DIAGNOSIS — H26491 Other secondary cataract, right eye: Secondary | ICD-10-CM | POA: Diagnosis not present

## 2021-04-05 DIAGNOSIS — H11133 Conjunctival pigmentations, bilateral: Secondary | ICD-10-CM | POA: Diagnosis not present

## 2021-04-05 DIAGNOSIS — Z9889 Other specified postprocedural states: Secondary | ICD-10-CM | POA: Diagnosis not present

## 2021-04-05 DIAGNOSIS — H43811 Vitreous degeneration, right eye: Secondary | ICD-10-CM | POA: Diagnosis not present

## 2021-04-05 DIAGNOSIS — H0102A Squamous blepharitis right eye, upper and lower eyelids: Secondary | ICD-10-CM | POA: Diagnosis not present

## 2021-04-05 DIAGNOSIS — E119 Type 2 diabetes mellitus without complications: Secondary | ICD-10-CM | POA: Diagnosis not present

## 2021-04-05 DIAGNOSIS — H16223 Keratoconjunctivitis sicca, not specified as Sjogren's, bilateral: Secondary | ICD-10-CM | POA: Diagnosis not present

## 2021-04-14 DIAGNOSIS — J9611 Chronic respiratory failure with hypoxia: Secondary | ICD-10-CM | POA: Diagnosis not present

## 2021-04-18 ENCOUNTER — Encounter: Payer: Self-pay | Admitting: Urology

## 2021-04-18 DIAGNOSIS — N281 Cyst of kidney, acquired: Secondary | ICD-10-CM | POA: Diagnosis not present

## 2021-04-18 DIAGNOSIS — H0520 Unspecified exophthalmos: Secondary | ICD-10-CM | POA: Diagnosis not present

## 2021-04-18 DIAGNOSIS — H0589 Other disorders of orbit: Secondary | ICD-10-CM | POA: Diagnosis not present

## 2021-04-18 DIAGNOSIS — D4102 Neoplasm of uncertain behavior of left kidney: Secondary | ICD-10-CM | POA: Diagnosis not present

## 2021-04-19 DIAGNOSIS — G4733 Obstructive sleep apnea (adult) (pediatric): Secondary | ICD-10-CM | POA: Diagnosis not present

## 2021-05-14 DIAGNOSIS — J9611 Chronic respiratory failure with hypoxia: Secondary | ICD-10-CM | POA: Diagnosis not present

## 2021-05-19 ENCOUNTER — Other Ambulatory Visit: Payer: Self-pay

## 2021-05-19 ENCOUNTER — Ambulatory Visit: Payer: Medicare Other | Admitting: Cardiovascular Disease

## 2021-05-19 ENCOUNTER — Encounter: Payer: Self-pay | Admitting: Cardiovascular Disease

## 2021-05-19 DIAGNOSIS — E039 Hypothyroidism, unspecified: Secondary | ICD-10-CM

## 2021-05-19 DIAGNOSIS — G4733 Obstructive sleep apnea (adult) (pediatric): Secondary | ICD-10-CM

## 2021-05-19 DIAGNOSIS — Q2112 Patent foramen ovale: Secondary | ICD-10-CM | POA: Diagnosis not present

## 2021-05-19 DIAGNOSIS — E785 Hyperlipidemia, unspecified: Secondary | ICD-10-CM

## 2021-05-19 DIAGNOSIS — I10 Essential (primary) hypertension: Secondary | ICD-10-CM | POA: Diagnosis not present

## 2021-05-19 DIAGNOSIS — R931 Abnormal findings on diagnostic imaging of heart and coronary circulation: Secondary | ICD-10-CM

## 2021-05-19 DIAGNOSIS — J439 Emphysema, unspecified: Secondary | ICD-10-CM

## 2021-05-19 MED ORDER — AMLODIPINE BESYLATE 5 MG PO TABS: 7.5000 mg | ORAL_TABLET | Freq: Every day | ORAL | 3 refills | Status: DC

## 2021-05-19 MED ORDER — METOPROLOL SUCCINATE ER 50 MG PO TB24: 75.0000 mg | ORAL_TABLET | Freq: Every day | ORAL | 3 refills | Status: AC

## 2021-05-19 NOTE — Progress Notes (Signed)
Cardiology Office Note    Date:  05/21/2021   ID:  Karen Black, DOB 09/02/1945, MRN [id number]  PCP:  Maude Leriche, PA-C  Cardiologist:  Shelva Majestic, MD   3 month follow-up cardiology evaluation referred through the courtesy of Dr. Freda Jackson in this patient with obstructive sleep apnea, with low oxygen saturation, and a recent transthoracic echo raising concern for possible shunting.   History of Present Illness:  Karen Black is a 75 y.o. female who has a history of prior tobacco use, diabetes mellitus, pulmonary emphysema on CT imaging along with mild restrictive and diffusion defects on pulmonary function testing and obstructive sleep apnea on CPAP therapy.  She is followed by [facility] primary care.  She was seen by Dr. Erin Fulling at [facility] pulmonary for low oxygen saturation.  A walk in his office in March 2022 showed an O2 desaturation to 77% with the first lap of walking and with rapid return to 95% with rest.  She was not started on supplemental oxygen at that time.  However in her April 2022 evaluation was recommended that she initiate supplemental oxygen at 2 to 3 L.  She had undergone a transthoracic echo Doppler study   on October 22, 2020 which showed an EF of 55 to 60% without wall motion abnormality.  She had normal RV function.  There was mildly elevated pulmonary artery systolic pressure, mild biatrial enlargement, as well as mild aortic valve sclerosis without stenosis.  There was concern for possible shunting across the atrial septum, not definitive. She has a history of obstructive sleep apnea for over 20 years and most recently had a follow-up sleep study by Dr. Nehemiah Settle for evaluation of persisting hypoxemia in the setting of well-controlled OSA on CPAP on December 27, 2020.  The overall AHI was 3.1/h and RDI 9.7/h with a central index of 0.3/h.  During titration apneas and hypopneas were abolished at lower pressures but the most appropriate setting control where was was  with BiPAP at 24/20 cm of water.  The patient was treated with supplemental oxygen at 1 L.  Alternatively CPAP of 13-18 with oxygen was felt to be adequate with only a few hours.  It is felt that the patient's degree of hypoxemia on exertion does not seem to fit her CT chest findings or PFT findings.  As result, she is now referred for cardiology evaluation for further assessment.  When I saw her for my initial evaluation with me on February 21, 2021 with her history of hypertension and she was on amlodipine 5 mg, extended propranolol 160 mg SR, quinapril 40 mg, and triamterene/HCTZ 37.5/25 mg daily.  She has been on Stiolto Respimat and albuterol for her lungs.  She has hypothyroidism on levothyroxine 75 mcg.  She has a history of hyperlipidemia and has been on low-dose atorvastatin at 10 mg.  She was on metformin 500 mg daily and is followed by Dr. Loanne Drilling.  She denied any chest pain and was unaware of nocturnal arrhythmias.  She denies any awareness of atrial fibrillation.  She is now on supplemental oxygen with her BiPAP therapy.  During that evaluation, with her lung disease I recommended she change to a more cardioselective beta-blocker and discontinue propranolol extended release and initiated metoprolol succinate initially at 50 mg for the first 4 days within to increase to 75 mg daily.  I also scheduled her to undergo a transesophageal echo for further evaluation of possible shunting.  Ms. Fabre underwent her transesophageal echo on February 28, 2021.  This confirmed normal EF at 65 to 70% without wall motion abnormalities.  There was mild mitral regurgitation.  There was evidence for atrial level shunting detected by color-flow Doppler.  Agitated saline contrast bubble study also was positive with mild shunting observed within 3-6 cardiac cycles.  It was felt that this was due to a very small PFO with minimal right to left shunting and that the shunting is intermittent and very brief and most likely is  not the cause to explain her hypoxia/dyspnea.  Presently, she feels well.  She states her blood pressure at home typically runs between 213 and 086 systolically.  She is unaware of any cardiac arrhythmias.  She is using her BiPAP therapy.  Past Medical History:  Diagnosis Date   Diabetes ([place])    Dyslipidemia    Hypothyroidism    Sleep apnea     Surgical history is notable for hysterectomy  Current Medications: Outpatient Medications Prior to Visit  Medication Sig Dispense Refill   albuterol (VENTOLIN HFA) 108 (90 Base) MCG/ACT inhaler Inhale 2 puffs into the lungs every 6 (six) hours as needed for wheezing or shortness of breath. 8 g 6   atorvastatin (LIPITOR) 10 MG tablet Take 10 mg by mouth daily.     levothyroxine (SYNTHROID, LEVOTHROID) 75 MCG tablet Take 75 mcg by mouth daily before breakfast.     metFORMIN (GLUCOPHAGE) 500 MG tablet Take 500 mg by mouth 2 (two) times daily.     quinapril (ACCUPRIL) 40 MG tablet Take 40 mg by mouth 2 (two) times daily.     Tiotropium Bromide-Olodaterol (STIOLTO RESPIMAT) 2.5-2.5 MCG/ACT AERS Inhale 2 puffs into the lungs daily. 12 g 2   Tiotropium Bromide-Olodaterol (STIOLTO RESPIMAT) 2.5-2.5 MCG/ACT AERS Inhale 2 puffs into the lungs daily. 4 g 0   triamterene-hydrochlorothiazide (MAXZIDE-25) 37.5-25 MG tablet Take 1 capsule by mouth daily.     amLODipine (NORVASC) 5 MG tablet Take 7.5 mg by mouth daily.     metoprolol succinate (TOPROL-XL) 50 MG 24 hr tablet Take 1.5 tablets (75 mg total) by mouth daily. 135 tablet 3   propranolol ER (INDERAL LA) 160 MG SR capsule Take 160 mg by mouth daily.     No facility-administered medications prior to visit.     Allergies:   Patient has no known allergies.   Social History   Socioeconomic History   Marital status: Divorced    Spouse name: Not on file   Number of children: Not on file   Years of education: Not on file   Highest education level: Not on file  Occupational History   Not on file   Tobacco Use   Smoking status: Former   Smokeless tobacco: Never  Substance and Sexual Activity   Alcohol use: No   Drug use: Not on file   Sexual activity: Not on file  Other Topics Concern   Not on file  Social History Narrative   Not on file   Social Determinants of Health   Financial Resource Strain: Not on file  Food Insecurity: Not on file  Transportation Needs: Not on file  Physical Activity: Not on file  Stress: Not on file  Social Connections: Not on file    Socially she was born in [place], he has lived in [place] for 6 years.  She is divorced.  She has 1 daughter who who lives in the police department in [place].  Patient is a retired Optometrist.  She had  smoked for 30 years but quit 15 years ago.  She does not routinely exercise.   Family History:  The patient's family history includes Breast cancer (age of onset: 69) in her sister.  Both parents are deceased, mother died at age 5, father had liver cancer and died at age 56.  She has 2 deceased brothers 82 age 35 who had dementia and other age 82 who had thyroid issues.  She has 1 deceased sister who died of cancer at 52.  ROS General: Negative; No fevers, chills, or night sweats; obesity HEENT: Negative; No changes in vision or hearing, sinus congestion, difficulty swallowing Pulmonary: Pulmonary emphysema Cardiovascular: Negative; No chest pain, presyncope, syncope, palpitations GI: Negative; No nausea, vomiting, diarrhea, or abdominal pain GU: Negative; No dysuria, hematuria, or difficulty voiding Musculoskeletal: Negative; no myalgias, joint pain, or weakness Hematologic/Oncology: Negative; no easy bruising, bleeding Endocrine: Negative; no heat/cold intolerance; no diabetes Neuro: Negative; no changes in balance, headaches Skin: Negative; No rashes or skin lesions Psychiatric: Negative; No behavioral problems, depression Sleep: OSA, currently on BiPAP with supplemental O2 Other  comprehensive 14 point system review is negative.   PHYSICAL EXAM:   VS:  BP (!) 150/82   Pulse 74   Ht $R'5\' 3"'nm$  (1.6 m)   Wt 204 lb 9.6 oz (92.8 kg)   SpO2 97%   BMI 36.24 kg/m     Repeat blood pressure by me was 132/80  Wt Readings from Last 3 Encounters:  05/19/21 204 lb 9.6 oz (92.8 kg)  03/11/21 206 lb (93.4 kg)  03/01/21 205 lb (93 kg)    General: Alert, oriented, no distress.  Skin: normal turgor, no rashes, warm and dry HEENT: Normocephalic, atraumatic. Pupils equal round and reactive to light; sclera anicteric; extraocular muscles intact;  Nose without nasal septal hypertrophy Mouth/Parynx benign; Mallinpatti scale 4 Neck: No JVD, no carotid bruits; normal carotid upstroke Lungs: clear to ausculatation and percussion; no wheezing or rales Chest wall: without tenderness to palpitation Heart: PMI not displaced, RRR, s1 s2 normal, 1/6 systolic murmur, no diastolic murmur, no rubs, gallops, thrills, or heaves Abdomen: soft, nontender; no hepatosplenomehaly, BS+; abdominal aorta nontender and not dilated by palpation. Back: no CVA tenderness Pulses 2+ Musculoskeletal: full range of motion, normal strength, no joint deformities Extremities: no clubbing cyanosis or edema, Homan's sign negative  Neurologic: grossly nonfocal; Cranial nerves grossly wnl Psychologic: Normal mood and affect   Studies/Labs Reviewed:   May 19, 2021 ECG (independently read by me): NSR at 74, QS V1-2, No ST changes, no ectopy, normal intervals  February 21, 2021 ECG (independently read by me): NSR at 70, QS V1-2; normal intervals  Recent Labs: BMP Latest Ref Rng & Units 02/21/2021 01/28/2020 04/18/2018  Glucose 65 - 99 mg/dL 88 - -  BUN 8 - 27 mg/dL 19 16 -  Creatinine 0.57 - 1.00 mg/dL 1.24(H) 1.1 -  BUN/Creat Ratio 12 - 28 15 - -  Sodium 134 - 144 mmol/L 143 - -  Potassium 3.5 - 5.2 mmol/L 4.2 - -  Chloride 96 - 106 mmol/L 101 - -  CO2 20 - 29 mmol/L 22 - -  Calcium 8.7 - 10.3 mg/dL 10.7(H)  - 9.8     Hepatic Function Latest Ref Rng & Units 03/11/2021 04/18/2018 07/19/2017  Total Protein 6.0 - 8.3 g/dL - 7.7 7.6  Albumin 3.5 - 5.2 g/dL - 3.9 4.1  AST 0 - 37 U/L - 20 19  ALT 0 - 35 U/L - 13 13  Alk Phosphatase 39 - 117 U/L 164(H) 143(H) 169(H)  Total Bilirubin 0.2 - 1.2 mg/dL - 0.7 0.6  Bilirubin, Direct 0.0 - 0.3 mg/dL - 0.1 0.2    CBC Latest Ref Rng & Units 02/21/2021  WBC 3.4 - 10.8 x10E3/uL 9.1  Hemoglobin 11.1 - 15.9 g/dL 13.8  Hematocrit 34.0 - 46.6 % 46.2  Platelets 150 - 450 x10E3/uL 276   Lab Results  Component Value Date   MCV 73 (L) 02/21/2021   Lab Results  Component Value Date   TSH 1.22 01/28/2020   Lab Results  Component Value Date   HGBA1C 6.8 01/28/2020     BNP No results found for: BNP  ProBNP No results found for: PROBNP   Lipid Panel  No results found for: CHOL, TRIG, HDL, CHOLHDL, VLDL, LDLCALC, LDLDIRECT, LABVLDL   RADIOLOGY: No results found.   Additional studies/ records that were reviewed today include:  I personally reviewed the records of [facility], laboratory from the normal triad, patient's BiPAP titration study by Dr. Nehemiah Settle.  ECHO: 10/22/2020 IMPRESSIONS   1. Left ventricular ejection fraction, by estimation, is 55 to 60%. The  left ventricle has normal function. The left ventricle has no regional  wall motion abnormalities. Left ventricular diastolic parameters are  indeterminate.   2. Right ventricular systolic function is normal. The right ventricular  size is normal. There is mildly elevated pulmonary artery systolic  pressure.   3. Left atrial size was mildly dilated.   4. Right atrial size was mildly dilated.   5. The mitral valve is normal in structure. Trivial mitral valve  regurgitation. No evidence of mitral stenosis.   6. The aortic valve is tricuspid. There is mild calcification of the  aortic valve. Aortic valve regurgitation is not visualized. Mild aortic  valve sclerosis is present, with no  evidence of aortic valve stenosis.   7. Possible shunting seen.    ECHO: 03/01/2021 IMPRESSIONS   1. Left ventricular ejection fraction, by estimation, is 65 to 70%. The  left ventricle has normal function. The left ventricle has no regional  wall motion abnormalities.   2. Right ventricular systolic function is normal. The right ventricular  size is normal.   3. No left atrial/left atrial appendage thrombus was detected.   4. The mitral valve is normal in structure. Mild mitral valve  regurgitation.   5. The aortic valve is tricuspid. Aortic valve regurgitation is not  visualized. No aortic stenosis is present.   6. Evidence of atrial level shunting detected by color flow Doppler.  Agitated saline contrast bubble study was positive with shunting observed  within 3-6 cardiac cycles suggestive of interatrial shunt.   FINDINGS   Left Ventricle: Left ventricular ejection fraction, by estimation, is 65  to 70%. The left ventricle has normal function. The left ventricle has no  regional wall motion abnormalities. The left ventricular internal cavity  size was normal in size.   Right Ventricle: The right ventricular size is normal. Right vetricular  wall thickness was not well visualized. Right ventricular systolic  function is normal.   Left Atrium: Left atrial size was normal in size. No left atrial/left  atrial appendage thrombus was detected.   Right Atrium: Right atrial size was normal in size.   Pericardium: Trivial pericardial effusion is present.   Mitral Valve: The mitral valve is normal in structure. Mild mitral valve  regurgitation.   Tricuspid Valve: The tricuspid valve is normal in structure. Tricuspid  valve regurgitation is not  demonstrated.   Aortic Valve: The aortic valve is tricuspid. Aortic valve regurgitation is  not visualized. No aortic stenosis is present.   Pulmonic Valve: The pulmonic valve was grossly normal. Pulmonic valve  regurgitation is not  visualized.   Aorta: The aortic root and ascending aorta are structurally normal, with  no evidence of dilitation.   IAS/Shunts: Evidence of atrial level shunting detected by color flow  Doppler. Agitated saline contrast was given intravenously to evaluate for  intracardiac shunting. Agitated saline contrast bubble study was positive  with shunting observed within 3-6  cardiac cycles suggestive of interatrial shunt. There is a very small PFO  with minimal right to left shunting. The shunting is intermittant and very  brief and likely does not explain her hypoxemia / dyspnea.   ASSESSMENT:    1. Essential hypertension   2. Hypothyroidism, unspecified type   3. Small PFO (patent foramen ovale)   4. OSA (obstructive sleep apnea) on BiPAP   5. Hyperlipidemia with target LDL less than 70   6. Pulmonary emphysema, unspecified emphysema type [facility])     PLAN:  Karen Black is a very pleasant 75 year old female who is originally from [place].  She has a previous 35-year history of tobacco use and fortunately quit 15 years ago.  She has evidence of pulmonary emphysema on CT imaging and has been demonstrated to have mild restrictive and diffusion defect on pulmonary function studies.  She has a longstanding history of obstructive sleep apnea for over 20 years and has had issues with recent significant oxygen desaturation on exertion.  She has been demonstrated to have significant nocturnal hypoxemia and is now on BiPAP with supplemental oxygenation.  Her degree of hypoxemia on exertion has been felt to be out of proportion to her CT chest findings or PFT findings.  A transthoracic echo Doppler evaluation has confirmed normal systolic function without wall motion abnormalities.  Diastolic parameters were indeterminate.  Her right ventricle size and function are normal and she had evidence for mild pulmonary artery systolic pressure.  On her transthoracic echo, concern was raised about possible  shunting but this could not be verified.  Since her initial evaluation she underwent a transesophageal echo which verified evidence for a very small PFO interatrial shunt that was intermittent and very brief and not likely to be the cause of her hypoxemia and dyspnea.  She has felt improved with change to cardioselective beta-blocker from propranolol to metoprolol succinate and is now on 75 mg daily.  She continues to be on quinapril 40 mg, triamterene/HCTZ, and amlodipine 5 mg.  Her blood pressures at home have consistently been 130 or higher.  I have recommended slight titration of amlodipine to 7.5 mg with target blood pressure less than 130/80.  If blood pressure continues to be elevated further dose escalation to 10 mg was recommended.  She is on levothyroxine 75 mcg for hypothyroidism.  She is diabetic on metformin.  She is tolerating atorvastatin for lipid management.  I will see her in 6 months for reevaluation.   Medication Adjustments/Labs and Tests Ordered: Current medicines are reviewed at length with the patient today.  Concerns regarding medicines are outlined above.  Medication changes, Labs and Tests ordered today are listed in the Patient Instructions below. Patient Instructions  Medication Instructions:  Take metoprolol succinate 75 mg (one and one-half tablet) each day.  Take 7.5 mg of amlodipine (one and one-half tablet) each day.  *If you need a refill on your cardiac  medications before your next appointment, please call your pharmacy*  Follow-Up: At [facility], you and your health needs are our priority.  As part of our continuing mission to provide you with exceptional heart care, we have created designated Provider Care Teams.  These Care Teams include your primary Cardiologist (physician) and Advanced Practice Providers (APPs -  Physician Assistants and Nurse Practitioners) who all work together to provide you with the care you need, when you need it.  We recommend  signing up for the patient portal called "MyChart".  Sign up information is provided on this After Visit Summary.  MyChart is used to connect with patients for Virtual Visits (Telemedicine).  Patients are able to view lab/test results, encounter notes, upcoming appointments, etc.  Non-urgent messages can be sent to your provider as well.   To learn more about what you can do with MyChart, go to [email or web address].    Your next appointment:   6 month(s)  The format for your next appointment:   In Person  Provider:   Shelva Majestic, MD     Signed, Shelva Majestic, MD  05/21/2021 5:11 PM    [facility] [address], [place], [place], [place]  [address] Phone: [phone]

## 2021-05-19 NOTE — Patient Instructions (Signed)
Medication Instructions:  Take metoprolol succinate 75 mg (one and one-half tablet) each day.  Take 7.5 mg of amlodipine (one and one-half tablet) each day.  *If you need a refill on your cardiac medications before your next appointment, please call your pharmacy*  Follow-Up: At [facility], you and your health needs are our priority.  As part of our continuing mission to provide you with exceptional heart care, we have created designated Provider Care Teams.  These Care Teams include your primary Cardiologist (physician) and Advanced Practice Providers (APPs -  Physician Assistants and Nurse Practitioners) who all work together to provide you with the care you need, when you need it.  We recommend signing up for the patient portal called "MyChart".  Sign up information is provided on this After Visit Summary.  MyChart is used to connect with patients for Virtual Visits (Telemedicine).  Patients are able to view lab/test results, encounter notes, upcoming appointments, etc.  Non-urgent messages can be sent to your provider as well.   To learn more about what you can do with MyChart, go to [email or web address].    Your next appointment:   6 month(s)  The format for your next appointment:   In Person  Provider:   Shelva Majestic, MD

## 2021-05-21 ENCOUNTER — Encounter: Payer: Self-pay | Admitting: Cardiovascular Disease

## 2021-06-14 DIAGNOSIS — J9611 Chronic respiratory failure with hypoxia: Secondary | ICD-10-CM | POA: Diagnosis not present

## 2021-07-14 DIAGNOSIS — J9611 Chronic respiratory failure with hypoxia: Secondary | ICD-10-CM | POA: Diagnosis not present

## 2021-08-03 DIAGNOSIS — N289 Disorder of kidney and ureter, unspecified: Secondary | ICD-10-CM | POA: Diagnosis not present

## 2021-08-03 DIAGNOSIS — E890 Postprocedural hypothyroidism: Secondary | ICD-10-CM | POA: Diagnosis not present

## 2021-08-03 DIAGNOSIS — Z79899 Other long term (current) drug therapy: Secondary | ICD-10-CM | POA: Diagnosis not present

## 2021-08-03 DIAGNOSIS — Z0000 Encounter for general adult medical examination without abnormal findings: Secondary | ICD-10-CM | POA: Diagnosis not present

## 2021-08-03 DIAGNOSIS — E1169 Type 2 diabetes mellitus with other specified complication: Secondary | ICD-10-CM | POA: Diagnosis not present

## 2021-08-03 DIAGNOSIS — I10 Essential (primary) hypertension: Secondary | ICD-10-CM | POA: Diagnosis not present

## 2021-08-03 DIAGNOSIS — E785 Hyperlipidemia, unspecified: Secondary | ICD-10-CM | POA: Diagnosis not present

## 2021-08-03 DIAGNOSIS — Z23 Encounter for immunization: Secondary | ICD-10-CM | POA: Diagnosis not present

## 2021-08-05 ENCOUNTER — Other Ambulatory Visit: Payer: Self-pay | Admitting: Urology

## 2021-08-05 DIAGNOSIS — N281 Cyst of kidney, acquired: Secondary | ICD-10-CM

## 2021-08-10 DIAGNOSIS — D49512 Neoplasm of unspecified behavior of left kidney: Secondary | ICD-10-CM | POA: Diagnosis not present

## 2021-08-14 DIAGNOSIS — J9611 Chronic respiratory failure with hypoxia: Secondary | ICD-10-CM | POA: Diagnosis not present

## 2021-08-31 ENCOUNTER — Ambulatory Visit
Admission: RE | Admit: 2021-08-31 | Discharge: 2021-08-31 | Disposition: A | Discharge status: Home / Self Care | Payer: Medicare Other | Source: Ambulatory Visit | Attending: Endocrinology | Admitting: Endocrinology

## 2021-08-31 DIAGNOSIS — Z780 Asymptomatic menopausal state: Secondary | ICD-10-CM | POA: Diagnosis not present

## 2021-09-09 ENCOUNTER — Ambulatory Visit (INDEPENDENT_AMBULATORY_CARE_PROVIDER_SITE_OTHER): Payer: Medicare Other | Admitting: Endocrinology

## 2021-09-09 ENCOUNTER — Other Ambulatory Visit: Payer: Self-pay

## 2021-09-09 VITALS — BP 164/100 | HR 77 | Ht 63.0 in | Wt 205.0 lb

## 2021-09-09 DIAGNOSIS — R748 Abnormal levels of other serum enzymes: Secondary | ICD-10-CM | POA: Diagnosis not present

## 2021-09-09 DIAGNOSIS — E039 Hypothyroidism, unspecified: Secondary | ICD-10-CM | POA: Diagnosis not present

## 2021-09-09 DIAGNOSIS — M889 Osteitis deformans of unspecified bone: Secondary | ICD-10-CM

## 2021-09-09 LAB — ALKALINE PHOSPHATASE: Alkaline Phosphatase: 165 U/L — ABNORMAL HIGH (ref 39–117)

## 2021-09-09 LAB — TSH: TSH: 1.13 u[IU]/mL (ref 0.35–5.50)

## 2021-09-09 LAB — T4, FREE: Free T4: 0.85 ng/dL (ref 0.60–1.60)

## 2021-09-09 LAB — VITAMIN D 25 HYDROXY (VIT D DEFICIENCY, FRACTURES): VITD: 45.04 ng/mL (ref 30.00–100.00)

## 2021-09-09 NOTE — Patient Instructions (Addendum)
Blood tests are requested for you today.  We'll let you know about the results.  Please come back for a follow-up appointment in 1 year.    

## 2021-09-09 NOTE — Progress Notes (Signed)
Subjective:    Patient ID: Karen Black, female    DOB: 1945/07/31, 76 y.o.   MRN: [id number]  HPI Pt returns for f/u of Paget's Dz (dx'ed 2018, when AP was high; she has never been on rx for this; isoenzymes indicated bone source, but Vit-D, PTH, and creat were normal; bone scan was abnormal only at the left shoulder; she has never had bony fx; plain films showed Paget's; hepatic US showed only NASH; she also had right shoulder and left knee pain, but x-rays did not show Paget's).  Denies arthralgias.  DEXA was normal last week.  She does not take dedicated Vit-D supplement.   Past Medical History:  Diagnosis Date   Diabetes ([place])    Dyslipidemia    Hypothyroidism    Sleep apnea     Past Surgical History:  Procedure Laterality Date   BUBBLE STUDY  03/01/2021   Procedure: BUBBLE STUDY;  Surgeon: Thayer Headings, MD;  Location: [facility];  Service: Cardiovascular;;   HYSTERECTOMY ABDOMINAL WITH SALPINGO-OOPHORECTOMY     TEE WITHOUT CARDIOVERSION N/A 03/01/2021   Procedure: TRANSESOPHAGEAL ECHOCARDIOGRAM (TEE);  Surgeon: Acie Fredrickson, Wonda Cheng, MD;  Location: [facility] ENDOSCOPY;  Service: Cardiovascular;  Laterality: N/A;    Social History   Socioeconomic History   Marital status: Divorced    Spouse name: Not on file   Number of children: Not on file   Years of education: Not on file   Highest education level: Not on file  Occupational History   Not on file  Tobacco Use   Smoking status: Former   Smokeless tobacco: Never  Substance and Sexual Activity   Alcohol use: No   Drug use: Not on file   Sexual activity: Not on file  Other Topics Concern   Not on file  Social History Narrative   Not on file   Social Determinants of Health   Financial Resource Strain: Not on file  Food Insecurity: Not on file  Transportation Needs: Not on file  Physical Activity: Not on file  Stress: Not on file  Social Connections: Not on file  Intimate Partner Violence: Not on file    Current  Outpatient Medications on File Prior to Visit  Medication Sig Dispense Refill   albuterol (VENTOLIN HFA) 108 (90 Base) MCG/ACT inhaler Inhale 2 puffs into the lungs every 6 (six) hours as needed for wheezing or shortness of breath. 8 g 6   amLODipine (NORVASC) 5 MG tablet Take 1.5 tablets (7.5 mg total) by mouth daily. 135 tablet 3   atorvastatin (LIPITOR) 10 MG tablet Take 10 mg by mouth daily.     levothyroxine (SYNTHROID, LEVOTHROID) 75 MCG tablet Take 75 mcg by mouth daily before breakfast.     metFORMIN (GLUCOPHAGE) 500 MG tablet Take 500 mg by mouth 2 (two) times daily.     quinapril (ACCUPRIL) 40 MG tablet Take 40 mg by mouth 2 (two) times daily.     Tiotropium Bromide-Olodaterol (STIOLTO RESPIMAT) 2.5-2.5 MCG/ACT AERS Inhale 2 puffs into the lungs daily. 12 g 2   Tiotropium Bromide-Olodaterol (STIOLTO RESPIMAT) 2.5-2.5 MCG/ACT AERS Inhale 2 puffs into the lungs daily. 4 g 0   triamterene-hydrochlorothiazide (MAXZIDE-25) 37.5-25 MG tablet Take 1 capsule by mouth daily.     metoprolol succinate (TOPROL-XL) 50 MG 24 hr tablet Take 1.5 tablets (75 mg total) by mouth daily. 135 tablet 3   No current facility-administered medications on file prior to visit.    No Known Allergies  Family History  Problem  Relation Age of Onset   Breast cancer Sister 40    BP (!) 164/100    Pulse 77    Ht 5\' 3"  (1.6 m)    Wt 205 lb (93 kg)    SpO2 92%    BMI 36.31 kg/m    Review of Systems     Objective:   Physical Exam VITAL SIGNS:  See vs page GENERAL: no distress GAIT: normal and steady.   Lab Results  Component Value Date   TSH 1.13 09/09/2021      Assessment & Plan:  Hypothyroidism: well-controlled.  Please continue the same synthroid Paget's Dz: check labs today

## 2021-09-12 LAB — PTH, INTACT AND CALCIUM
Calcium: 10 mg/dL (ref 8.6–10.4)
PTH: 39 pg/mL (ref 16–77)

## 2021-09-12 LAB — ALKALINE PHOSPHATASE, BONE SPECIFIC: ALKALINE PHOSPHATASE, BONE SPECIFIC: 49.9 ug/L — ABNORMAL HIGH (ref 5.6–29.0)

## 2021-09-13 ENCOUNTER — Other Ambulatory Visit: Payer: Self-pay

## 2021-09-13 ENCOUNTER — Encounter: Payer: Self-pay | Admitting: Pulmonary Disease

## 2021-09-13 ENCOUNTER — Ambulatory Visit: Payer: Medicare Other | Admitting: Pulmonary Disease

## 2021-09-13 VITALS — BP 138/82 | HR 81 | Ht 63.0 in | Wt 200.0 lb

## 2021-09-13 DIAGNOSIS — J9611 Chronic respiratory failure with hypoxia: Secondary | ICD-10-CM

## 2021-09-13 DIAGNOSIS — Z9989 Dependence on other enabling machines and devices: Secondary | ICD-10-CM

## 2021-09-13 DIAGNOSIS — J439 Emphysema, unspecified: Secondary | ICD-10-CM

## 2021-09-13 DIAGNOSIS — G4733 Obstructive sleep apnea (adult) (pediatric): Secondary | ICD-10-CM

## 2021-09-13 NOTE — Progress Notes (Signed)
Synopsis: Referred in March 2022 by Eric Form, NP for low oxygen saturations  Subjective:   PATIENT ID: Karen Black GENDER: female DOB: 1946/03/25, MRN: [id number]  HPI  Chief Complaint  Patient presents with   Follow-up    F/U on emphysema. Has not been using Stiolto on a regular basis.    Karen Black is a 76 year old woman, former smoker with diabetes and sleep apnea on CPAP who returns to pulmonary clinic for chronic respiratory failure.   She has been doing well since last visit.  She does have portable oxygen concentrator but reports it is very heavy and she does not use it often.  When she exerts herself she takes frequent breaks due to shortness of breath.  She is using CPAP with 3 L supplemental oxygen at night.  She was provided Stiolto inhaler at last visit but did not try this.  She is using albuterol inhaler as needed and reports infrequent use.  Cardiology note from 05/19/2021 reviewed.  She had TEE on 03/01/2021 which showed small right to left interatrial shunting.  Pulmonary function test today show mild restriction and mild diffusion defect.  OV 11/12/20 On simple walk in our office on 10/05/20 she had SpO2 saturations to 77% upon the first lap of walking with rapid return to 95% SpO2 with rest. She did not wish to be started on supplemental oxygen at that time. She has similar declines in the office again today upon ambulation. Overall her breathing has been stable since last visit. She does notice benefit from the albuterol inhaler.   ECHO on 10/22/2020 shows normal LVEF and function, normal RV function and size with mildly elevated PA systolic pressure. She has mildly enlarged left and right atria. Unable to perform bubble study due to difficulty obtaining IV access but there is reported possible shunting on this TTE study.  Pulmonary function tests today show mild restrictive and diffusion defects.   I spoke with Carlos Levering PA at [facility] after the  last visit about having her sleep apnea re-assessed for concern of needing supplemental oxygen with her CPAP machine. Patient reports she is scheduled for an in lab sleep study in July.   She reports she will be flying to [place] [facility] soon to watch the promotion of her daughter who is in the [employer].  Past Medical History:  Diagnosis Date   Diabetes ([place])    Dyslipidemia    Hypothyroidism    Sleep apnea      Family History  Problem Relation Age of Onset   Breast cancer Sister 10     Social History   Socioeconomic History   Marital status: Divorced    Spouse name: Not on file   Number of children: Not on file   Years of education: Not on file   Highest education level: Not on file  Occupational History   Not on file  Tobacco Use   Smoking status: Former   Smokeless tobacco: Never  Substance and Sexual Activity   Alcohol use: No   Drug use: Not on file   Sexual activity: Not on file  Other Topics Concern   Not on file  Social History Narrative   Not on file   Social Determinants of Health   Financial Resource Strain: Not on file  Food Insecurity: Not on file  Transportation Needs: Not on file  Physical Activity: Not on file  Stress: Not on file  Social Connections: Not on file  Intimate Partner  Violence: Not on file     No Known Allergies   Outpatient Medications Prior to Visit  Medication Sig Dispense Refill   albuterol (VENTOLIN HFA) 108 (90 Base) MCG/ACT inhaler Inhale 2 puffs into the lungs every 6 (six) hours as needed for wheezing or shortness of breath. 8 g 6   amLODipine (NORVASC) 5 MG tablet Take 1.5 tablets (7.5 mg total) by mouth daily. 135 tablet 3   atorvastatin (LIPITOR) 10 MG tablet Take 20 mg by mouth daily.     Cyanocobalamin (VITAMIN B-12 PO) Take by mouth.     enalapril (VASOTEC) 20 MG tablet Take 40 mg by mouth daily.     levothyroxine (SYNTHROID, LEVOTHROID) 75 MCG tablet Take 75 mcg by mouth daily before breakfast.      metFORMIN (GLUCOPHAGE) 500 MG tablet Take 500 mg by mouth 2 (two) times daily.     metoprolol succinate (TOPROL-XL) 50 MG 24 hr tablet Take 1.5 tablets (75 mg total) by mouth daily. 135 tablet 3   Omega-3 Fatty Acids (OMEGA-3 FISH OIL PO) Take by mouth.     triamterene-hydrochlorothiazide (MAXZIDE-25) 37.5-25 MG tablet Take 1 capsule by mouth daily.     quinapril (ACCUPRIL) 40 MG tablet Take 40 mg by mouth 2 (two) times daily.     Tiotropium Bromide-Olodaterol (STIOLTO RESPIMAT) 2.5-2.5 MCG/ACT AERS Inhale 2 puffs into the lungs daily. 12 g 2   Tiotropium Bromide-Olodaterol (STIOLTO RESPIMAT) 2.5-2.5 MCG/ACT AERS Inhale 2 puffs into the lungs daily. 4 g 0   No facility-administered medications prior to visit.    Review of Systems  Constitutional:  Negative for chills, fever, malaise/fatigue and weight loss.  HENT:  Negative for congestion, sinus pain and sore throat.   Eyes: Negative.   Respiratory:  Positive for shortness of breath. Negative for cough, hemoptysis, sputum production and wheezing.   Cardiovascular:  Negative for chest pain, palpitations, orthopnea, claudication and leg swelling.  Gastrointestinal:  Negative for abdominal pain, heartburn, nausea and vomiting.  Genitourinary: Negative.   Musculoskeletal:  Negative for joint pain and myalgias.  Skin:  Negative for rash.  Neurological:  Negative for weakness.  Endo/Heme/Allergies: Negative.   Psychiatric/Behavioral: Negative.     Objective:   Vitals:   09/13/21 1505  BP: 138/82  Pulse: 81  SpO2: 95%  Weight: 200 lb (90.7 kg)  Height: 5\' 3"  (1.6 m)     Physical Exam Constitutional:      General: She is not in acute distress.    Appearance: She is obese. She is not ill-appearing.  HENT:     Head: Normocephalic and atraumatic.  Eyes:     General: No scleral icterus.    Conjunctiva/sclera: Conjunctivae normal.  Cardiovascular:     Rate and Rhythm: Normal rate and regular rhythm.     Pulses: Normal pulses.      Heart sounds: Normal heart sounds. No murmur heard. Pulmonary:     Effort: Pulmonary effort is normal.     Breath sounds: Normal breath sounds. No wheezing, rhonchi or rales.  Musculoskeletal:     Right lower leg: No edema.     Left lower leg: No edema.  Skin:    General: Skin is warm and dry.  Neurological:     General: No focal deficit present.     Mental Status: She is alert.  Psychiatric:        Mood and Affect: Mood normal.        Behavior: Behavior normal.  Thought Content: Thought content normal.        Judgment: Judgment normal.   CBC    Component Value Date/Time   WBC 9.1 02/21/2021 1428   RBC 6.30 (H) 02/21/2021 1428   HGB 13.8 02/21/2021 1428   HCT 46.2 02/21/2021 1428   PLT 276 02/21/2021 1428   MCV 73 (L) 02/21/2021 1428   MCH 21.9 (L) 02/21/2021 1428   MCHC 29.9 (L) 02/21/2021 1428   RDW 14.9 02/21/2021 1428     Chest imaging: CT Chest Lung Cancer Screening 09/15/20 1. Lung-RADS 2s, benign appearance or behavior. Continue annual screening with low-dose chest CT without contrast in 12 months.  Mediastinum/Nodes: Normal appearance of the thyroid gland. The trachea appears patent and is midline. Normal appearance of the esophagus. No enlarged lymph nodes.   Lungs/Pleura: Mild changes of centrilobular and paraseptal emphysema. Scarring is noted within both lung bases. Several tiny nodules are identified. The largest is in the superior segment of left lower lobe with an equivalent diameter of 3.9 mm. No suspicious lung nodules identified at this time.  There is a conglomeration of multiple large complicated cyst arising off the upper pole of the left kidney. This is incompletely characterized without IV contrast material  PFT: PFT Results Latest Ref Rng & Units 11/12/2020  FVC-Pre L 1.81  FVC-Predicted Pre % 82  FVC-Post L 1.98  FVC-Predicted Post % 89  Pre FEV1/FVC % % 83  Post FEV1/FCV % % 83  FEV1-Pre L 1.50  FEV1-Predicted Pre % 88   FEV1-Post L 1.65  DLCO uncorrected ml/min/mmHg 14.03  DLCO UNC% % 73  DLCO corrected ml/min/mmHg 14.03  DLCO COR %Predicted % 73  DLVA Predicted % 107  TLC L 3.58  TLC % Predicted % 70  RV % Predicted % 71  PFTs 2023 mild restriction and mild diffusion defect     Assessment & Plan:   Chronic hypoxemic respiratory failure (HCC)  Obstructive sleep apnea on CPAP  Pulmonary emphysema, unspecified emphysema type (HCC)  Discussion: Karen Black is a 76 year old woman, former smoker with diabetes and sleep apnea on CPAP who returns to pulmonary clinic for chronic hypoxemic respiratory failure.   She has evidence of pulmonary emphysema on CT imaging along with mild restrictive and diffusion defects on pulmonary function testing.  TEE shows mild right to left interatrial shunt.  Her respiratory failure is due to a combination of the above findings.  She is to continue supplemental oxygen with exertion and I encouraged her to use her portable oxygen concentrator is much as possible.  She will be contacting [employer] if there is another machine that she could use that is not as heavy.  She is to try Stiolto 2 puffs daily and monitor for any improvement in her breathing.  Otherwise if she does not no improvement she can continue as needed albuterol.  She is being followed by a lung cancer screening team and has an upcoming scan next month.  Follow-up in 1 year.  Freda Jackson, MD [facility] Office: [phone]   Current Outpatient Medications:    albuterol (VENTOLIN HFA) 108 (90 Base) MCG/ACT inhaler, Inhale 2 puffs into the lungs every 6 (six) hours as needed for wheezing or shortness of breath., Disp: 8 g, Rfl: 6   amLODipine (NORVASC) 5 MG tablet, Take 1.5 tablets (7.5 mg total) by mouth daily., Disp: 135 tablet, Rfl: 3   atorvastatin (LIPITOR) 10 MG tablet, Take 20 mg by mouth daily., Disp: ,  Rfl:    Cyanocobalamin (VITAMIN B-12 PO), Take by mouth., Disp:  , Rfl:    enalapril (VASOTEC) 20 MG tablet, Take 40 mg by mouth daily., Disp: , Rfl:    levothyroxine (SYNTHROID, LEVOTHROID) 75 MCG tablet, Take 75 mcg by mouth daily before breakfast., Disp: , Rfl:    metFORMIN (GLUCOPHAGE) 500 MG tablet, Take 500 mg by mouth 2 (two) times daily., Disp: , Rfl:    metoprolol succinate (TOPROL-XL) 50 MG 24 hr tablet, Take 1.5 tablets (75 mg total) by mouth daily., Disp: 135 tablet, Rfl: 3   Omega-3 Fatty Acids (OMEGA-3 FISH OIL PO), Take by mouth., Disp: , Rfl:    triamterene-hydrochlorothiazide (MAXZIDE-25) 37.5-25 MG tablet, Take 1 capsule by mouth daily., Disp: , Rfl:

## 2021-09-13 NOTE — Patient Instructions (Addendum)
Try stiolto inhaler 2 puffs daily for 1 month - if you notice improvement in your breathing let us know and we will call in a prescription  Continue Albuterol inhaler 1-2 puffs every 4-6 hours as needed.   Continue to [place] portable oxygen concentrator. Talk with [employer] about getting a lighter machine. [employer] has some of the lightest portable concentrators on the market.  Continue CPAP with oxygen at night.

## 2021-09-14 ENCOUNTER — Encounter: Payer: Self-pay | Admitting: Pulmonary Disease

## 2021-10-10 ENCOUNTER — Other Ambulatory Visit: Payer: Self-pay

## 2021-10-10 ENCOUNTER — Ambulatory Visit
Admission: RE | Admit: 2021-10-10 | Discharge: 2021-10-10 | Disposition: A | Discharge status: Home / Self Care | Payer: Medicare Other | Source: Ambulatory Visit | Attending: Urology | Admitting: Urology

## 2021-10-10 DIAGNOSIS — K8689 Other specified diseases of pancreas: Secondary | ICD-10-CM | POA: Diagnosis not present

## 2021-10-10 DIAGNOSIS — K8020 Calculus of gallbladder without cholecystitis without obstruction: Secondary | ICD-10-CM | POA: Diagnosis not present

## 2021-10-10 DIAGNOSIS — N281 Cyst of kidney, acquired: Secondary | ICD-10-CM

## 2021-10-10 MED ORDER — GADOBENATE DIMEGLUMINE 529 MG/ML IV SOLN
19.0000 mL | Freq: Once | INTRAVENOUS | Status: AC | PRN
  Administered 2021-10-10: 19 mL via INTRAVENOUS

## 2021-10-12 DIAGNOSIS — J9611 Chronic respiratory failure with hypoxia: Secondary | ICD-10-CM | POA: Diagnosis not present

## 2021-10-17 DIAGNOSIS — R935 Abnormal findings on diagnostic imaging of other abdominal regions, including retroperitoneum: Secondary | ICD-10-CM | POA: Diagnosis not present

## 2021-10-17 DIAGNOSIS — N281 Cyst of kidney, acquired: Secondary | ICD-10-CM | POA: Diagnosis not present

## 2021-11-12 DIAGNOSIS — J9611 Chronic respiratory failure with hypoxia: Secondary | ICD-10-CM | POA: Diagnosis not present

## 2021-11-14 DIAGNOSIS — G4733 Obstructive sleep apnea (adult) (pediatric): Secondary | ICD-10-CM | POA: Diagnosis not present

## 2021-11-16 DIAGNOSIS — E785 Hyperlipidemia, unspecified: Secondary | ICD-10-CM | POA: Diagnosis not present

## 2021-12-12 DIAGNOSIS — J9611 Chronic respiratory failure with hypoxia: Secondary | ICD-10-CM | POA: Diagnosis not present

## 2021-12-14 ENCOUNTER — Other Ambulatory Visit (HOSPITAL_COMMUNITY): Payer: Self-pay

## 2021-12-14 ENCOUNTER — Telehealth: Payer: Self-pay | Admitting: Pulmonary Disease

## 2021-12-14 ENCOUNTER — Other Ambulatory Visit: Payer: Self-pay | Admitting: Urology

## 2021-12-14 DIAGNOSIS — Z1231 Encounter for screening mammogram for malignant neoplasm of breast: Secondary | ICD-10-CM

## 2021-12-14 NOTE — Telephone Encounter (Signed)
pt states she called in on the [facility] automated system to refill her stiolto on 05/20 and hasnt received a refill yet, and also states her insurance charges her 130.00 and is would prefer a medication more cost effective  (Please confirm pharmacy)! I Confirmed pharmacy but pt stated that when she last received her medication it came in [facility] [place] [facility]  [facility] (P) [phone] ([phone]

## 2021-12-14 NOTE — Telephone Encounter (Signed)
Patient states she has enough of her Stiolto inhaler to last her until we get a response form Dr. Erin Fulling PA team. She states that her current inhaler is $130 a month and would like to know if Dr. Erin Fulling would be willing to switch her inahlers. PA team, can you help [place] find out what the cheapest inhaler options are for the patient? And then we will route to Dr. Erin Fulling. Patient is aware this could take 24-48 hours. Thanks!

## 2021-12-14 NOTE — Telephone Encounter (Signed)
Please send in prescription for Anoro Ellipta 1 puff daily and cancel her stiolto prescription.  Thanks, JD

## 2021-12-14 NOTE — Telephone Encounter (Signed)
Linna Darner, [employer]  You 33 minutes ago (11:39 AM)   Good morning. The copay for the Anoro Ellipta is $47, Charolotte Eke is also $47, Trelegy is $47, and Judithann Sauger is $47 as well.     Dr. Erin Fulling please advise if you are okay with patient changing from current inhaler and switching to one the above options due to cost?

## 2021-12-16 MED ORDER — ANORO ELLIPTA 62.5-25 MCG/ACT IN AEPB: 1.0000 | INHALATION_SPRAY | Freq: Every day | RESPIRATORY_TRACT | 3 refills | Status: DC

## 2021-12-16 NOTE — Telephone Encounter (Signed)
Spoke with the pt and notified of response per Dr Erin Fulling  Pt verbalized understanding  Nothing further needed  Rx for anoro sent to preferred pharm

## 2021-12-26 NOTE — Progress Notes (Signed)
Cardiology Office Note:    Date:  12/27/2021   ID:  Karen Black, DOB 04/29/1946, MRN [id number]  PCP:  Maude Leriche, PA-C (Inactive)  Cardiologist:  Shelva Majestic, MD  Electrophysiologist:  None   Referring MD: No ref. provider found   Chief Complaint: follow-up of small PFO and hypertension  History of Present Illness:    Karen Black is a 76 y.o. female with a history of  small PFO noted on TEE in 02/2021, pulmonary emphysema with chronic hypoxemic respiratory failure followed by Pulmonology, hypertension, dyslipidemia, type 2 diabetes mellitus, hypothyroidism, and obstructive sleep apnea on CPAP who is followed by Dr. Claiborne Billings and presents today for routine follow-up.  Patient has a long history of pulmonary disease with both pulmonary emphysema noted on prior CT imaging and mild restrictive and diffusion defects on prior PFTs.  She also has known obstructive sleep apnea.  She was previously on BiPAP for this but is now on CPAP with 3 L of supplemental O2 at night.  This is followed by Pulmonology.  She was referred to Dr. Claiborne Billings in 02/2021 for further evaluation of possible shunting noted on recent TTE.  There was concern that this was the cause of her persistent hypoxemia.  TTE in 10/2020 had shown LVEF of 55-60% with no regional wall motion abnormalities, normal RV, mildly elevated PASP, and possible shunting.  Therefore, Dr. Claiborne Billings ordered a TEE at that initial visit which showed LVEF of 65-70% with evidence of atrial level shunting detected by color-flow Doppler.  Agitated saline contrast bubble study was also positive with shunting observed within 3-6 cardiac cycles suggestive of intra-atrial shunt.  It was felt that this was due to a very small PFO with minimal right to left shunting.  The shunting was intermittent and very brief and therefore was felt to most likely not be the cause of her hypoxia/dyspnea.  Patient was last seen by Dr. Claiborne Billings in 05/2021 at which time she noted  improvement with change to cardioselective beta-blocker (she was previously switched from Propranolol to Toprol-XL).  Her amlodipine was increased for further BP control.  Patient presents today for further evaluation. Here alone. Patient is doing well from a cardiac standpoint.  She states she did have 1 isolated episode of atypical left arm pain a couple nights ago before going to bed.  She describes this as a very brief "pinching" sensation like someone was sticking her with.  This only last for about a second at a time and would then go away.  However it happened multiple times over no more than 20 minutes.  She denies any other episodes of left arm pain.  She denies any chest pain.  She goes to the gym 3 days a week and does chair yoga and is also able to walk around the grocery store without any symptoms. This sounds very atypical to me and does not sound like an anginal equivalent.  She has chronic shortness of breath but states this is much improved.  No orthopnea, PND, lower extremity edema.  No palpitations, lightheadedness, dizziness, syncope.  Her BP is borderline elevated today in the office but she states she forgot to take her medicines this morning.  Past Medical History:  Diagnosis Date   Diabetes ([place])    Dyslipidemia    Hypothyroidism    Sleep apnea     Past Surgical History:  Procedure Laterality Date   BUBBLE STUDY  03/01/2021   Procedure: BUBBLE STUDY;  Surgeon: Thayer Headings, MD;  Location:  [facility] ENDOSCOPY;  Service: Cardiovascular;;   HYSTERECTOMY ABDOMINAL WITH SALPINGO-OOPHORECTOMY     TEE WITHOUT CARDIOVERSION N/A 03/01/2021   Procedure: TRANSESOPHAGEAL ECHOCARDIOGRAM (TEE);  Surgeon: Thayer Headings, MD;  Location: [facility] ENDOSCOPY;  Service: Cardiovascular;  Laterality: N/A;    Current Medications: Current Meds  Medication Sig   albuterol (VENTOLIN HFA) 108 (90 Base) MCG/ACT inhaler Inhale 2 puffs into the lungs every 6 (six) hours as needed for wheezing or shortness  of breath.   amLODipine (NORVASC) 5 MG tablet Take 1.5 tablets (7.5 mg total) by mouth daily.   atorvastatin (LIPITOR) 20 MG tablet Take 20 mg by mouth daily.   Cyanocobalamin (VITAMIN B-12 PO) Take by mouth.   enalapril (VASOTEC) 20 MG tablet Take 40 mg by mouth daily.   levothyroxine (SYNTHROID, LEVOTHROID) 75 MCG tablet Take 75 mcg by mouth daily before breakfast.   metFORMIN (GLUCOPHAGE) 500 MG tablet Take 500 mg by mouth 2 (two) times daily.   Omega-3 Fatty Acids (OMEGA-3 FISH OIL PO) Take by mouth.   Tiotropium Bromide-Olodaterol (STIOLTO RESPIMAT) 2.5-2.5 MCG/ACT AERS Inhale 1 each into the lungs in the morning and at bedtime.   triamterene-hydrochlorothiazide (MAXZIDE-25) 37.5-25 MG tablet Take 1 capsule by mouth daily.   umeclidinium-vilanterol (ANORO ELLIPTA) 62.5-25 MCG/ACT AEPB Inhale 1 puff into the lungs daily.     Allergies:   Patient has no known allergies.   Social History   Socioeconomic History   Marital status: Divorced    Spouse name: Not on file   Number of children: Not on file   Years of education: Not on file   Highest education level: Not on file  Occupational History   Not on file  Tobacco Use   Smoking status: Former   Smokeless tobacco: Never  Substance and Sexual Activity   Alcohol use: No   Drug use: Not on file   Sexual activity: Not on file  Other Topics Concern   Not on file  Social History Narrative   Not on file   Social Determinants of Health   Financial Resource Strain: Not on file  Food Insecurity: Not on file  Transportation Needs: Not on file  Physical Activity: Not on file  Stress: Not on file  Social Connections: Not on file     Family History: The patient's family history includes Breast cancer (age of onset: 98) in her sister.  ROS:   Please see the history of present illness.     EKGs/Labs/Other Studies Reviewed:    The following studies were reviewed today:  TTE 10/22/2020: Impressions:  1. Left ventricular  ejection fraction, by estimation, is 55 to 60%. The  left ventricle has normal function. The left ventricle has no regional  wall motion abnormalities. Left ventricular diastolic parameters are  indeterminate.   2. Right ventricular systolic function is normal. The right ventricular  size is normal. There is mildly elevated pulmonary artery systolic  pressure.   3. Left atrial size was mildly dilated.   4. Right atrial size was mildly dilated.   5. The mitral valve is normal in structure. Trivial mitral valve  regurgitation. No evidence of mitral stenosis.   6. The aortic valve is tricuspid. There is mild calcification of the  aortic valve. Aortic valve regurgitation is not visualized. Mild aortic  valve sclerosis is present, with no evidence of aortic valve stenosis.   7. Possible shunting seen.   Comparison(s): No prior Echocardiogram.  _______________  TEE 03/01/2021: Impressions: 1. Left ventricular ejection fraction, by  estimation, is 65 to 70%. The  left ventricle has normal function. The left ventricle has no regional  wall motion abnormalities.   2. Right ventricular systolic function is normal. The right ventricular  size is normal.   3. No left atrial/left atrial appendage thrombus was detected.   4. The mitral valve is normal in structure. Mild mitral valve  regurgitation.   5. The aortic valve is tricuspid. Aortic valve regurgitation is not  visualized. No aortic stenosis is present.   6. Evidence of atrial level shunting detected by color flow Doppler.  Agitated saline contrast bubble study was positive with shunting observed  within 3-6 cardiac cycles suggestive of interatrial shunt.   EKG:  EKG ordered today. EKG personally reviewed and demonstrates normal sinus rhythm, rate 81 bpm, with PVC but no acute ST/T changes. Normal axis. Normal PR and QRS intervals. QTc 418 ms.  Recent Labs: 02/21/2021: BUN 19; Creatinine, Ser 1.24; Hemoglobin 13.8; Platelets 276;  Potassium 4.2; Sodium 143 09/09/2021: TSH 1.13  Recent Lipid Panel No results found for: "CHOL", "TRIG", "HDL", "CHOLHDL", "VLDL", "LDLCALC", "LDLDIRECT"  Physical Exam:    Vital Signs: BP (!) 142/74   Pulse 91   Ht '5\' 4"'$  (1.626 m)   Wt 197 lb 9.6 oz (89.6 kg)   SpO2 91%   BMI 33.92 kg/m     Wt Readings from Last 3 Encounters:  12/27/21 197 lb 9.6 oz (89.6 kg)  09/13/21 200 lb (90.7 kg)  09/09/21 205 lb (93 kg)     General: 75 y.o. African-American female in no acute distress. HEENT: Normocephalic and atraumatic. Sclera clear.  Neck: Supple. No carotid bruits. No JVD. Heart: RRR. Distinct S1 and S2. No murmurs, gallops, or rubs.  Lungs: No increased work of breathing. Clear to ausculation bilaterally. No wheezes, rhonchi, or rales.  Abdomen: Soft, non-distended, and non-tender to palpation.  Extremities: No lower extremity edema.    Skin: Warm and dry. Neuro: Alert and oriented x3. No focal deficits. Psych: Normal affect. Responds appropriately.  Assessment:    1. Left arm pain   2. Small PFO   3. Primary hypertension   4. Hyperlipidemia, unspecified hyperlipidemia type   5. Type 2 diabetes mellitus with complication, without long-term current use of insulin (HCC)   6. Pulmonary emphysema, unspecified emphysema type ([place])   7. Obstructive sleep apnea     Plan:    Left Arm Pain Patient reports one episode of left arm pain a couple of nights ago that felt like being stuck with a pin needle. Pain only lasted for about 1 second at a time and then resolved but recurred multiple times over about 20 minutes. No other episodes of left arm pain. No chest pain at all. No exertional symptoms.  - EKG today showed no acute ischemic changes. - This sounds very atypical to me. I do not think this was an anginal equivalent. No ischemic work-up necessary at this time. However, asked patient to let us know if she has an recurrent left arm or chest pain. She voiced understanding and  agreed.  Small PFO TEE in 02/2021 showed LVEF of 65-70% with evidence of atrial level shunting detected by color-flow Doppler.  Agitated saline contrast bubble study was also positive with shunting observed within 3-6 cardiac cycles suggestive of intra-atrial shunt. It was felt that this was due to a very small PFO with minimal right to left shunting.  The shunting was intermittent and very brief and therefore was felt to most likely  not be the cause of her hypoxia/dyspnea. - No further work-up necessary at this time.  Hypertension BP borderline elevated in the office today. BP initially 142/74 and then 138/68 on my personal recheck at the end of visit. However, patient states she forgot to take her medications this morning. - Continue current medications: Amlodipine 7.'5mg'$  daily, Enalapril '40mg'$  daily, Triamterene-HCTZ 37.5-'25mg'$  daily, and Toprol-XL '75mg'$  daily.  Hyperlipidemia No lipid panel seen in [facility] system. - Continue Lipitor '20mg'$  daily. - Labs followed by PCP. Patient states she thinks she is having labs drawn soon.   Type 2 Diabetes Mellitus Hemoglobin A1c 6.9% in 02/2021.  - Managed via Endocrinology (Dr. Renato Shin).  Emphysema with Chronic Hypoxemic Respiratory Failure Obstructive Sleep Apnea On CPAP with 3L of supplemental O2 at night.  - Continue. - Followed by Pulmonology (Dr. Erin Fulling).  Disposition: Follow up in 6 months.   Medication Adjustments/Labs and Tests Ordered: Current medicines are reviewed at length with the patient today.  Concerns regarding medicines are outlined above.  Orders Placed This Encounter  Procedures   EKG 12-Lead   No orders of the defined types were placed in this encounter.   Patient Instructions  Medication Instructions:  No Changes *If you need a refill on your cardiac medications before your next appointment, please call your pharmacy*   Lab Work: No Labs If you have labs (blood work) drawn today and your tests are completely  normal, you will receive your results only by: [place] (if you have MyChart) OR A paper copy in the mail If you have any lab test that is abnormal or we need to change your treatment, we will call you to review the results.   Testing/Procedures: No Testing   Follow-Up: At [facility], you and your health needs are our priority.  As part of our continuing mission to provide you with exceptional heart care, we have created designated Provider Care Teams.  These Care Teams include your primary Cardiologist (physician) and Advanced Practice Providers (APPs -  Physician Assistants and Nurse Practitioners) who all work together to provide you with the care you need, when you need it.  We recommend signing up for the patient portal called "MyChart".  Sign up information is provided on this After Visit Summary.  MyChart is used to connect with patients for Virtual Visits (Telemedicine).  Patients are able to view lab/test results, encounter notes, upcoming appointments, etc.  Non-urgent messages can be sent to your provider as well.   To learn more about what you can do with MyChart, go to [email or web address].    Your next appointment:   6 month(s)  The format for your next appointment:   In Person  Provider:   Sande Rives, PA-C    or , Shelva Majestic, MD will plan to see you again in .{       Important Information About Sugar         Signed, Darreld Mclean, PA-C  12/27/2021 3:20 PM    [place]

## 2021-12-27 ENCOUNTER — Ambulatory Visit: Payer: Medicare Other | Admitting: Student

## 2021-12-27 ENCOUNTER — Encounter: Payer: Self-pay | Admitting: Student

## 2021-12-27 VITALS — BP 142/74 | HR 91 | Ht 64.0 in | Wt 197.6 lb

## 2021-12-27 DIAGNOSIS — E118 Type 2 diabetes mellitus with unspecified complications: Secondary | ICD-10-CM | POA: Diagnosis not present

## 2021-12-27 DIAGNOSIS — G4733 Obstructive sleep apnea (adult) (pediatric): Secondary | ICD-10-CM | POA: Diagnosis not present

## 2021-12-27 DIAGNOSIS — E785 Hyperlipidemia, unspecified: Secondary | ICD-10-CM | POA: Diagnosis not present

## 2021-12-27 DIAGNOSIS — I10 Essential (primary) hypertension: Secondary | ICD-10-CM | POA: Diagnosis not present

## 2021-12-27 DIAGNOSIS — Q2112 Patent foramen ovale: Secondary | ICD-10-CM

## 2021-12-27 DIAGNOSIS — J439 Emphysema, unspecified: Secondary | ICD-10-CM | POA: Diagnosis not present

## 2021-12-27 DIAGNOSIS — M79602 Pain in left arm: Secondary | ICD-10-CM

## 2021-12-27 NOTE — Patient Instructions (Signed)
Medication Instructions:  No Changes *If you need a refill on your cardiac medications before your next appointment, please call your pharmacy*   Lab Work: No Labs If you have labs (blood work) drawn today and your tests are completely normal, you will receive your results only by: [place] (if you have MyChart) OR A paper copy in the mail If you have any lab test that is abnormal or we need to change your treatment, we will call you to review the results.   Testing/Procedures: No Testing   Follow-Up: At [facility], you and your health needs are our priority.  As part of our continuing mission to provide you with exceptional heart care, we have created designated Provider Care Teams.  These Care Teams include your primary Cardiologist (physician) and Advanced Practice Providers (APPs -  Physician Assistants and Nurse Practitioners) who all work together to provide you with the care you need, when you need it.  We recommend signing up for the patient portal called "MyChart".  Sign up information is provided on this After Visit Summary.  MyChart is used to connect with patients for Virtual Visits (Telemedicine).  Patients are able to view lab/test results, encounter notes, upcoming appointments, etc.  Non-urgent messages can be sent to your provider as well.   To learn more about what you can do with MyChart, go to [email or web address].    Your next appointment:   6 month(s)  The format for your next appointment:   In Person  Provider:   Sande Rives, PA-C    or , Shelva Majestic, MD will plan to see you again in .{       Important Information About Sugar

## 2022-01-12 ENCOUNTER — Ambulatory Visit
Admission: RE | Admit: 2022-01-12 | Discharge: 2022-01-12 | Disposition: A | Discharge status: Home / Self Care | Payer: Medicare Other | Source: Ambulatory Visit | Attending: Urology | Admitting: Urology

## 2022-01-12 DIAGNOSIS — J9611 Chronic respiratory failure with hypoxia: Secondary | ICD-10-CM | POA: Diagnosis not present

## 2022-01-12 DIAGNOSIS — Z1231 Encounter for screening mammogram for malignant neoplasm of breast: Secondary | ICD-10-CM

## 2022-02-01 DIAGNOSIS — E785 Hyperlipidemia, unspecified: Secondary | ICD-10-CM | POA: Diagnosis not present

## 2022-02-01 DIAGNOSIS — E890 Postprocedural hypothyroidism: Secondary | ICD-10-CM | POA: Diagnosis not present

## 2022-02-01 DIAGNOSIS — J028 Acute pharyngitis due to other specified organisms: Secondary | ICD-10-CM | POA: Diagnosis not present

## 2022-02-01 DIAGNOSIS — I10 Essential (primary) hypertension: Secondary | ICD-10-CM | POA: Diagnosis not present

## 2022-02-01 DIAGNOSIS — Z20822 Contact with and (suspected) exposure to covid-19: Secondary | ICD-10-CM | POA: Diagnosis not present

## 2022-02-01 DIAGNOSIS — E1169 Type 2 diabetes mellitus with other specified complication: Secondary | ICD-10-CM | POA: Diagnosis not present

## 2022-02-10 ENCOUNTER — Ambulatory Visit
Admission: RE | Admit: 2022-02-10 | Discharge: 2022-02-10 | Disposition: A | Discharge status: Home / Self Care | Payer: Medicare Other | Source: Ambulatory Visit | Attending: Physician Assistant | Admitting: Physician Assistant

## 2022-02-10 ENCOUNTER — Other Ambulatory Visit: Payer: Self-pay | Admitting: Physician Assistant

## 2022-02-10 DIAGNOSIS — J209 Acute bronchitis, unspecified: Secondary | ICD-10-CM | POA: Diagnosis not present

## 2022-02-10 DIAGNOSIS — J9811 Atelectasis: Secondary | ICD-10-CM | POA: Diagnosis not present

## 2022-02-10 DIAGNOSIS — J439 Emphysema, unspecified: Secondary | ICD-10-CM | POA: Diagnosis not present

## 2022-02-10 DIAGNOSIS — J9611 Chronic respiratory failure with hypoxia: Secondary | ICD-10-CM | POA: Diagnosis not present

## 2022-02-10 DIAGNOSIS — R918 Other nonspecific abnormal finding of lung field: Secondary | ICD-10-CM | POA: Diagnosis not present

## 2022-02-11 DIAGNOSIS — J9611 Chronic respiratory failure with hypoxia: Secondary | ICD-10-CM | POA: Diagnosis not present

## 2022-03-03 ENCOUNTER — Telehealth: Payer: Self-pay | Admitting: Pulmonary Disease

## 2022-03-03 MED ORDER — STIOLTO RESPIMAT 2.5-2.5 MCG/ACT IN AERS: 1.0000 | INHALATION_SPRAY | Freq: Two times a day (BID) | RESPIRATORY_TRACT | 4 refills | Status: DC

## 2022-03-03 NOTE — Addendum Note (Signed)
Addended by: Retia Passe on: 03/03/2022 12:33 PM   Modules accepted: Orders

## 2022-03-03 NOTE — Telephone Encounter (Signed)
Spoke with pt who states Stiolto and Anoro are the same price. Pt would prefer to use Stiolto. Dr. Erin Fulling please advise. Pt will need 3 month supply orders.

## 2022-03-03 NOTE — Telephone Encounter (Signed)
Called and spoke to patient and verified pharmacy with her. Told her Dr Erin Fulling was ok with the 3 month supply. Nothing further needed

## 2022-03-03 NOTE — Telephone Encounter (Signed)
Supply on stiolto    Pharmacy [facility]

## 2022-03-13 ENCOUNTER — Telehealth: Payer: Self-pay | Admitting: Pulmonary Disease

## 2022-03-13 ENCOUNTER — Ambulatory Visit: Payer: Medicare Other | Admitting: Endocrinology

## 2022-03-13 MED ORDER — STIOLTO RESPIMAT 2.5-2.5 MCG/ACT IN AERS: 2.0000 | INHALATION_SPRAY | Freq: Every day | RESPIRATORY_TRACT | 3 refills | Status: DC

## 2022-03-13 NOTE — Telephone Encounter (Signed)
Called and spoke with patient.  Patient made aware Stiolto prescription with instructions sent to [facility], 90 days supply. Nothing further at this time.

## 2022-03-14 DIAGNOSIS — J9611 Chronic respiratory failure with hypoxia: Secondary | ICD-10-CM | POA: Diagnosis not present

## 2022-03-29 DIAGNOSIS — G4733 Obstructive sleep apnea (adult) (pediatric): Secondary | ICD-10-CM | POA: Diagnosis not present

## 2022-03-29 DIAGNOSIS — I10 Essential (primary) hypertension: Secondary | ICD-10-CM | POA: Diagnosis not present

## 2022-03-29 DIAGNOSIS — J9611 Chronic respiratory failure with hypoxia: Secondary | ICD-10-CM | POA: Diagnosis not present

## 2022-03-29 DIAGNOSIS — J439 Emphysema, unspecified: Secondary | ICD-10-CM | POA: Diagnosis not present

## 2022-04-06 DIAGNOSIS — I10 Essential (primary) hypertension: Secondary | ICD-10-CM | POA: Diagnosis not present

## 2022-04-06 DIAGNOSIS — Z23 Encounter for immunization: Secondary | ICD-10-CM | POA: Diagnosis not present

## 2022-04-10 DIAGNOSIS — Z961 Presence of intraocular lens: Secondary | ICD-10-CM | POA: Diagnosis not present

## 2022-04-10 DIAGNOSIS — H0102A Squamous blepharitis right eye, upper and lower eyelids: Secondary | ICD-10-CM | POA: Diagnosis not present

## 2022-04-10 DIAGNOSIS — H0102B Squamous blepharitis left eye, upper and lower eyelids: Secondary | ICD-10-CM | POA: Diagnosis not present

## 2022-04-10 DIAGNOSIS — E119 Type 2 diabetes mellitus without complications: Secondary | ICD-10-CM | POA: Diagnosis not present

## 2022-04-10 DIAGNOSIS — H26491 Other secondary cataract, right eye: Secondary | ICD-10-CM | POA: Diagnosis not present

## 2022-04-10 DIAGNOSIS — H11133 Conjunctival pigmentations, bilateral: Secondary | ICD-10-CM | POA: Diagnosis not present

## 2022-04-10 DIAGNOSIS — H0520 Unspecified exophthalmos: Secondary | ICD-10-CM | POA: Diagnosis not present

## 2022-04-10 DIAGNOSIS — H43811 Vitreous degeneration, right eye: Secondary | ICD-10-CM | POA: Diagnosis not present

## 2022-04-14 DIAGNOSIS — J9611 Chronic respiratory failure with hypoxia: Secondary | ICD-10-CM | POA: Diagnosis not present

## 2022-04-20 DIAGNOSIS — G4733 Obstructive sleep apnea (adult) (pediatric): Secondary | ICD-10-CM | POA: Diagnosis not present

## 2022-05-12 DIAGNOSIS — E1169 Type 2 diabetes mellitus with other specified complication: Secondary | ICD-10-CM | POA: Diagnosis not present

## 2022-05-12 DIAGNOSIS — E890 Postprocedural hypothyroidism: Secondary | ICD-10-CM | POA: Diagnosis not present

## 2022-05-12 DIAGNOSIS — I10 Essential (primary) hypertension: Secondary | ICD-10-CM | POA: Diagnosis not present

## 2022-05-12 DIAGNOSIS — E785 Hyperlipidemia, unspecified: Secondary | ICD-10-CM | POA: Diagnosis not present

## 2022-05-14 DIAGNOSIS — J9611 Chronic respiratory failure with hypoxia: Secondary | ICD-10-CM | POA: Diagnosis not present

## 2022-06-14 DIAGNOSIS — J9611 Chronic respiratory failure with hypoxia: Secondary | ICD-10-CM | POA: Diagnosis not present

## 2022-07-14 DIAGNOSIS — J9611 Chronic respiratory failure with hypoxia: Secondary | ICD-10-CM | POA: Diagnosis not present

## 2022-07-26 ENCOUNTER — Telehealth: Payer: Self-pay | Admitting: Pulmonary Disease

## 2022-07-26 ENCOUNTER — Other Ambulatory Visit: Payer: Self-pay

## 2022-07-26 MED ORDER — STIOLTO RESPIMAT 2.5-2.5 MCG/ACT IN AERS: 1.0000 | INHALATION_SPRAY | Freq: Two times a day (BID) | RESPIRATORY_TRACT | 6 refills | Status: DC

## 2022-07-26 NOTE — Telephone Encounter (Signed)
Patient frustrated that she is having trouble filling Stiolto again. Patient needs Stiolto sent to [facility] for a 90 day supply (2 puffs daily). Last time we did not put frequency for the pharmacy and she had to wait weeks to get the medication. Please advise and call patient with an update.

## 2022-07-26 NOTE — Telephone Encounter (Signed)
Spoke to patient. Advised refills of Stiolto have been sent to [facility]. Nothing further needed.

## 2022-07-31 ENCOUNTER — Other Ambulatory Visit: Payer: Self-pay | Admitting: *Deleted

## 2022-07-31 MED ORDER — STIOLTO RESPIMAT 2.5-2.5 MCG/ACT IN AERS: 2.0000 | INHALATION_SPRAY | Freq: Every day | RESPIRATORY_TRACT | 3 refills | Status: DC

## 2022-07-31 NOTE — Telephone Encounter (Signed)
Spoke with [facility] who states pt's Stiolto has been processed and should arrive to pt by end of the week. Called and updated pt as well who stated understanding. Nothing further needed at this time.

## 2022-08-14 DIAGNOSIS — J9611 Chronic respiratory failure with hypoxia: Secondary | ICD-10-CM | POA: Diagnosis not present

## 2022-08-24 DIAGNOSIS — Z9981 Dependence on supplemental oxygen: Secondary | ICD-10-CM | POA: Diagnosis not present

## 2022-08-24 DIAGNOSIS — E890 Postprocedural hypothyroidism: Secondary | ICD-10-CM | POA: Diagnosis not present

## 2022-08-24 DIAGNOSIS — J449 Chronic obstructive pulmonary disease, unspecified: Secondary | ICD-10-CM | POA: Diagnosis not present

## 2022-08-24 DIAGNOSIS — Z0000 Encounter for general adult medical examination without abnormal findings: Secondary | ICD-10-CM | POA: Diagnosis not present

## 2022-08-24 DIAGNOSIS — J439 Emphysema, unspecified: Secondary | ICD-10-CM | POA: Diagnosis not present

## 2022-08-24 DIAGNOSIS — I700 Atherosclerosis of aorta: Secondary | ICD-10-CM | POA: Diagnosis not present

## 2022-08-24 DIAGNOSIS — I10 Essential (primary) hypertension: Secondary | ICD-10-CM | POA: Diagnosis not present

## 2022-08-24 DIAGNOSIS — K869 Disease of pancreas, unspecified: Secondary | ICD-10-CM | POA: Diagnosis not present

## 2022-08-24 DIAGNOSIS — Z79899 Other long term (current) drug therapy: Secondary | ICD-10-CM | POA: Diagnosis not present

## 2022-08-24 DIAGNOSIS — E1122 Type 2 diabetes mellitus with diabetic chronic kidney disease: Secondary | ICD-10-CM | POA: Diagnosis not present

## 2022-08-24 DIAGNOSIS — E785 Hyperlipidemia, unspecified: Secondary | ICD-10-CM | POA: Diagnosis not present

## 2022-08-24 DIAGNOSIS — G4733 Obstructive sleep apnea (adult) (pediatric): Secondary | ICD-10-CM | POA: Diagnosis not present

## 2022-08-25 ENCOUNTER — Other Ambulatory Visit: Payer: Self-pay | Admitting: Family Medicine

## 2022-08-25 DIAGNOSIS — K869 Disease of pancreas, unspecified: Secondary | ICD-10-CM

## 2022-09-03 ENCOUNTER — Ambulatory Visit
Admission: RE | Admit: 2022-09-03 | Discharge: 2022-09-03 | Disposition: A | Discharge status: Home / Self Care | Payer: Medicare Other | Source: Ambulatory Visit | Attending: Family Medicine | Admitting: Family Medicine

## 2022-09-03 DIAGNOSIS — N281 Cyst of kidney, acquired: Secondary | ICD-10-CM | POA: Diagnosis not present

## 2022-09-03 DIAGNOSIS — K869 Disease of pancreas, unspecified: Secondary | ICD-10-CM

## 2022-09-03 DIAGNOSIS — N2889 Other specified disorders of kidney and ureter: Secondary | ICD-10-CM | POA: Diagnosis not present

## 2022-09-03 DIAGNOSIS — K8020 Calculus of gallbladder without cholecystitis without obstruction: Secondary | ICD-10-CM | POA: Diagnosis not present

## 2022-09-03 DIAGNOSIS — K862 Cyst of pancreas: Secondary | ICD-10-CM | POA: Diagnosis not present

## 2022-09-05 ENCOUNTER — Other Ambulatory Visit: Payer: Self-pay | Admitting: Urology

## 2022-09-05 ENCOUNTER — Other Ambulatory Visit: Payer: Medicare Other

## 2022-09-05 DIAGNOSIS — N281 Cyst of kidney, acquired: Secondary | ICD-10-CM

## 2022-09-13 ENCOUNTER — Encounter: Payer: Self-pay | Admitting: Pulmonary Disease

## 2022-09-13 ENCOUNTER — Ambulatory Visit: Payer: Medicare Other | Admitting: Pulmonary Disease

## 2022-09-13 VITALS — BP 144/76 | HR 90 | Ht 64.0 in | Wt 198.0 lb

## 2022-09-13 DIAGNOSIS — J9611 Chronic respiratory failure with hypoxia: Secondary | ICD-10-CM | POA: Diagnosis not present

## 2022-09-13 DIAGNOSIS — R0602 Shortness of breath: Secondary | ICD-10-CM

## 2022-09-13 DIAGNOSIS — J439 Emphysema, unspecified: Secondary | ICD-10-CM

## 2022-09-13 DIAGNOSIS — G4733 Obstructive sleep apnea (adult) (pediatric): Secondary | ICD-10-CM | POA: Diagnosis not present

## 2022-09-13 NOTE — Progress Notes (Signed)
Synopsis: Referred in March 2022 by Eric Form, NP for low oxygen saturations  Subjective:   PATIENT ID: Karen Black GENDER: female DOB: Mar 06, 1946, MRN: [id number]  HPI  Chief Complaint  Patient presents with   Follow-up    104yrf/u. States she has been doing well since last visit.    Karen Hilinskiis a 77year old woman, former smoker with diabetes and sleep apnea on CPAP who returns to pulmonary clinic for chronic respiratory failure.   She has done well since last visit. She is using Stiolto 1 puff twice daily. She has not required much albuterol as needed. She is using oxygen with ambulation. No other complaints at this time.  OV 09/13/21 She has been doing well since last visit.  She does have portable oxygen concentrator but reports it is very heavy and she does not use it often.  When she exerts herself she takes frequent breaks due to shortness of breath.  She is using CPAP with 3 L supplemental oxygen at night.  She was provided Stiolto inhaler at last visit but did not try this.  She is using albuterol inhaler as needed and reports infrequent use.  Cardiology note from 05/19/2021 reviewed.  She had TEE on 03/01/2021 which showed small right to left interatrial shunting.  Pulmonary function test today show mild restriction and mild diffusion defect.  OV 11/12/20 On simple walk in our office on 10/05/20 she had SpO2 saturations to 77% upon the first lap of walking with rapid return to 95% SpO2 with rest. She did not wish to be started on supplemental oxygen at that time. She has similar declines in the office again today upon ambulation. Overall her breathing has been stable since last visit. She does notice benefit from the albuterol inhaler.   ECHO on 10/22/2020 shows normal LVEF and function, normal RV function and size with mildly elevated PA systolic pressure. She has mildly enlarged left and right atria. Unable to perform bubble study due to difficulty obtaining IV access  but there is reported possible shunting on this TTE study.  Pulmonary function tests today show mild restrictive and diffusion defects.   [facility] about having her sleep apnea re-assessed for concern of needing supplemental oxygen with her CPAP machine. Patient reports she is scheduled for an in lab sleep study in July.   [facility] her daughter who is in the [employer]  Past Medical History:  Diagnosis Date   Diabetes ([place]    Dyslipidemia    Hypothyroidism    Sleep apnea      Family History  Problem Relation Age of Onset   Breast cancer Sister 442    Social History   Socioeconomic History   Marital status: Divorced    Spouse name: Not on file   Number of children: Not on file   Years of education: Not on file   Highest education level: Not on file  Occupational History   Not on file  Tobacco Use   Smoking status: Former   Smokeless tobacco: Never  Substance and Sexual Activity   Alcohol use: No   Drug use: Not on file   Sexual activity: Not on file  Other Topics Concern   Not on file  Social History Narrative   Not on file   Social Determinants of Health   Financial  Resource Strain: Not on file  Food Insecurity: Not on file  Transportation Needs: Not on file  Physical Activity: Not on file  Stress: Not on file  Social Connections: Not on file  Intimate Partner Violence: Not on file     No Known Allergies   Outpatient Medications Prior to Visit  Medication Sig Dispense Refill   albuterol (VENTOLIN HFA) 108 (90 Base) MCG/ACT inhaler Inhale 2 puffs into the lungs every 6 (six) hours as needed for wheezing or shortness of breath. 8 g 6   amLODipine (NORVASC) 5 MG tablet Take 1.5 tablets (7.5 mg total) by mouth daily. 135 tablet 3   atorvastatin (LIPITOR) 20 MG tablet Take 20 mg by mouth daily.     Cyanocobalamin (VITAMIN  B-12 PO) Take by mouth.     enalapril (VASOTEC) 20 MG tablet Take 40 mg by mouth daily.     levothyroxine (SYNTHROID, LEVOTHROID) 75 MCG tablet Take 75 mcg by mouth daily before breakfast.     metFORMIN (GLUCOPHAGE) 500 MG tablet Take 500 mg by mouth 2 (two) times daily.     metoprolol succinate (TOPROL-XL) 50 MG 24 hr tablet Take 1.5 tablets (75 mg total) by mouth daily. 135 tablet 3   Omega-3 Fatty Acids (OMEGA-3 FISH OIL PO) Take by mouth.     Tiotropium Bromide-Olodaterol (STIOLTO RESPIMAT) 2.5-2.5 MCG/ACT AERS Inhale 2 puffs into the lungs daily. 12 g 3   triamterene-hydrochlorothiazide (MAXZIDE-25) 37.5-25 MG tablet Take 1 capsule by mouth daily.     No facility-administered medications prior to visit.    Review of Systems  Constitutional:  Negative for chills, fever, malaise/fatigue and weight loss.  HENT:  Negative for congestion, sinus pain and sore throat.   Eyes: Negative.   Respiratory:  Positive for shortness of breath. Negative for cough, hemoptysis, sputum production and wheezing.   Cardiovascular:  Negative for chest pain, palpitations, orthopnea, claudication and leg swelling.  Gastrointestinal:  Negative for abdominal pain, heartburn, nausea and vomiting.  Genitourinary: Negative.   Musculoskeletal:  Negative for joint pain and myalgias.  Skin:  Negative for rash.  Neurological:  Negative for weakness.  Endo/Heme/Allergies: Negative.   Psychiatric/Behavioral: Negative.      Objective:   Vitals:   09/13/22 0911  BP: (!) 144/76  Pulse: 90  SpO2: 93%  Weight: 198 lb (89.8 kg)  Height: '5\' 4"'$  (1.626 m)     Physical Exam Constitutional:      General: She is not in acute distress.    Appearance: She is obese. She is not ill-appearing.  HENT:     Head: Normocephalic and atraumatic.  Eyes:     General: No scleral icterus.    Conjunctiva/sclera: Conjunctivae normal.  Cardiovascular:     Rate and Rhythm: Normal rate and regular rhythm.     Pulses: Normal  pulses.     Heart sounds: Normal heart sounds. No murmur heard. Pulmonary:     Effort: Pulmonary effort is normal.     Breath sounds: Normal breath sounds. No wheezing, rhonchi or rales.  Musculoskeletal:     Right lower leg: No edema.     Left lower leg: No edema.  Skin:    General: Skin is warm and dry.  Neurological:     General: No focal deficit present.     Mental Status: She is alert.    CBC    Component Value Date/Time   WBC 9.1 02/21/2021 1428   RBC 6.30 (H) 02/21/2021 1428   HGB 13.8 02/21/2021  1428   HCT 46.2 02/21/2021 1428   PLT 276 02/21/2021 1428   MCV 73 (L) 02/21/2021 1428   MCH 21.9 (L) 02/21/2021 1428   MCHC 29.9 (L) 02/21/2021 1428   RDW 14.9 02/21/2021 1428   Chest imaging: CT Chest Lung Cancer Screening 09/15/20 1. Lung-RADS 2s, benign appearance or behavior. Continue annual screening with low-dose chest CT without contrast in 12 months.  Mediastinum/Nodes: Normal appearance of the thyroid gland. The trachea appears patent and is midline. Normal appearance of the esophagus. No enlarged lymph nodes.   Lungs/Pleura: Mild changes of centrilobular and paraseptal emphysema. Scarring is noted within both lung bases. Several tiny nodules are identified. The largest is in the superior segment of left lower lobe with an equivalent diameter of 3.9 mm. No suspicious lung nodules identified at this time.  There is a conglomeration of multiple large complicated cyst arising off the upper pole of the left kidney. This is incompletely characterized without IV contrast material  PFT:    Latest Ref Rng & Units 11/12/2020   10:27 AM  PFT Results  FVC-Pre L 1.81   FVC-Predicted Pre % 82   FVC-Post L 1.98   FVC-Predicted Post % 89   Pre FEV1/FVC % % 83   Post FEV1/FCV % % 83   FEV1-Pre L 1.50   FEV1-Predicted Pre % 88   FEV1-Post L 1.65   DLCO uncorrected ml/min/mmHg 14.03   DLCO UNC% % 73   DLCO corrected ml/min/mmHg 14.03   DLCO COR %Predicted % 73    DLVA Predicted % 107   TLC L 3.58   TLC % Predicted % 70   RV % Predicted % 71   PFTs 2023 mild restriction and mild diffusion defect     Assessment & Plan:   Chronic hypoxemic respiratory failure (HCC)  Obstructive sleep apnea on CPAP  Pulmonary emphysema, unspecified emphysema type (HCC)  Discussion: Karen Black is a 77 year old woman, former smoker with diabetes and sleep apnea on CPAP who returns to pulmonary clinic for chronic hypoxemic respiratory failure and emphysema.   We will message our lung cancer screening team for her to get an updated scan.   She is to continue supplemental oxygen with exertion.   She is to continue Stiolto 2 puffs daily instead of 1 puff twice daily. She can continue albuterol as needed.  Follow-up in 1 year.  Freda Jackson, MD [facility] Office: [phone]   Current Outpatient Medications:    albuterol (VENTOLIN HFA) 108 (90 Base) MCG/ACT inhaler, Inhale 2 puffs into the lungs every 6 (six) hours as needed for wheezing or shortness of breath., Disp: 8 g, Rfl: 6   amLODipine (NORVASC) 5 MG tablet, Take 1.5 tablets (7.5 mg total) by mouth daily., Disp: 135 tablet, Rfl: 3   atorvastatin (LIPITOR) 20 MG tablet, Take 20 mg by mouth daily., Disp: , Rfl:    Cyanocobalamin (VITAMIN B-12 PO), Take by mouth., Disp: , Rfl:    enalapril (VASOTEC) 20 MG tablet, Take 40 mg by mouth daily., Disp: , Rfl:    levothyroxine (SYNTHROID, LEVOTHROID) 75 MCG tablet, Take 75 mcg by mouth daily before breakfast., Disp: , Rfl:    metFORMIN (GLUCOPHAGE) 500 MG tablet, Take 500 mg by mouth 2 (two) times daily., Disp: , Rfl:    metoprolol succinate (TOPROL-XL) 50 MG 24 hr tablet, Take 1.5 tablets (75 mg total) by mouth daily., Disp: 135 tablet, Rfl: 3   Omega-3 Fatty Acids (OMEGA-3 FISH OIL  PO), Take by mouth., Disp: , Rfl:    Tiotropium Bromide-Olodaterol (STIOLTO RESPIMAT) 2.5-2.5 MCG/ACT AERS, Inhale 2 puffs into the lungs daily., Disp:  12 g, Rfl: 3   triamterene-hydrochlorothiazide (MAXZIDE-25) 37.5-25 MG tablet, Take 1 capsule by mouth daily., Disp: , Rfl:

## 2022-09-13 NOTE — Patient Instructions (Addendum)
Continue stiolto 2 puffs daily - take each morning  Use albuterol inhaler (red inhaler) 1-2 puffs every 4-6 hours as needed for cough, shortness of breath, or wheezing  Continue CPAP for your sleep apnea  Continue to use oxygen with activity  We will have our lung cancer screening team reach out to you to schedule your scan this year.  Follow up in 1 year, call sooner if needed

## 2022-09-14 DIAGNOSIS — J9611 Chronic respiratory failure with hypoxia: Secondary | ICD-10-CM | POA: Diagnosis not present

## 2022-09-18 DIAGNOSIS — G4733 Obstructive sleep apnea (adult) (pediatric): Secondary | ICD-10-CM | POA: Diagnosis not present

## 2022-10-06 ENCOUNTER — Ambulatory Visit: Payer: Medicare Other | Attending: Cardiovascular Disease | Admitting: Cardiovascular Disease

## 2022-10-06 ENCOUNTER — Encounter: Payer: Self-pay | Admitting: Cardiovascular Disease

## 2022-10-06 DIAGNOSIS — E039 Hypothyroidism, unspecified: Secondary | ICD-10-CM

## 2022-10-06 DIAGNOSIS — Q2112 Patent foramen ovale: Secondary | ICD-10-CM | POA: Diagnosis not present

## 2022-10-06 DIAGNOSIS — E118 Type 2 diabetes mellitus with unspecified complications: Secondary | ICD-10-CM | POA: Diagnosis not present

## 2022-10-06 DIAGNOSIS — I10 Essential (primary) hypertension: Secondary | ICD-10-CM

## 2022-10-06 DIAGNOSIS — G4733 Obstructive sleep apnea (adult) (pediatric): Secondary | ICD-10-CM

## 2022-10-06 DIAGNOSIS — J439 Emphysema, unspecified: Secondary | ICD-10-CM

## 2022-10-06 NOTE — Progress Notes (Unsigned)
Cardiology Office Note    Date:  10/08/2022   ID:  Karen Black, DOB [birth year], MRN ID:[id number]  PCP:  Maude Leriche, PA-C (Inactive)  Cardiologist:  Shelva Majestic, MD   16 month follow-up cardiology evaluation initially referred through the courtesy of Dr. Freda Jackson in this patient with obstructive sleep apnea, with low oxygen saturation, and a recent transthoracic echo raising concern for possible shunting.   History of Present Illness:  Karen Black is a 77 y.o. female who has a history of prior tobacco use, diabetes mellitus, pulmonary emphysema on CT imaging along with mild restrictive and diffusion defects on pulmonary function testing and obstructive sleep apnea on CPAP therapy.  She is followed by [facility] primary care.  She was seen by Dr. Erin Fulling at [facility] pulmonary for low oxygen saturation.  A walk in his office in March 2022 showed an O2 desaturation to 77% with the first lap of walking and with rapid return to 95% with rest.  She was not started on supplemental oxygen at that time.  However in her April 2022 evaluation was recommended that she initiate supplemental oxygen at 2 to 3 L.  She had undergone a transthoracic echo Doppler study   on October 22, 2020 which showed an EF of 55 to 60% without wall motion abnormality.  She had normal RV function.  There was mildly elevated pulmonary artery systolic pressure, mild biatrial enlargement, as well as mild aortic valve sclerosis without stenosis.  There was concern for possible shunting across the atrial septum, not definitive. She has a history of obstructive sleep apnea for over 20 years and most recently had a follow-up sleep study by Dr. Nehemiah Settle for evaluation of persisting hypoxemia in the setting of well-controlled OSA on CPAP on December 27, 2020.  The overall AHI was 3.1/h and RDI 9.7/h with a central index of 0.3/h.  During titration apneas and hypopneas were abolished at lower pressures but the most appropriate setting  control where was was with BiPAP at 24/20 cm of water.  The patient was treated with supplemental oxygen at 1 L.  Alternatively CPAP of 13-18 with oxygen was felt to be adequate with only a few hours.  It is felt that the patient's degree of hypoxemia on exertion does not seem to fit her CT chest findings or PFT findings.  As result, she is now referred for cardiology evaluation for further assessment.  When I saw her for my initial evaluation with me on February 21, 2021 with her history of hypertension and she was on amlodipine 5 mg, extended propranolol 160 mg SR, quinapril 40 mg, and triamterene/HCTZ 37.5/25 mg daily.  She has been on Stiolto Respimat and albuterol for her lungs.  She has hypothyroidism on levothyroxine 75 mcg.  She has a history of hyperlipidemia and has been on low-dose atorvastatin at 10 mg.  She was on metformin 500 mg daily and is followed by Dr. Loanne Drilling.  She denied any chest pain and was unaware of nocturnal arrhythmias.  She denies any awareness of atrial fibrillation.  She is now on supplemental oxygen with her BiPAP therapy.  During that evaluation, with her lung disease I recommended she change to a more cardioselective beta-blocker and discontinue propranolol extended release and initiated metoprolol succinate initially at 50 mg for the first 4 days within to increase to 75 mg daily.  I also scheduled her to undergo a transesophageal echo for further evaluation of possible shunting.  Karen Black underwent her transesophageal echo  on February 28, 2021.  This confirmed normal EF at 65 to 70% without wall motion abnormalities.  There was mild mitral regurgitation.  There was evidence for atrial level shunting detected by color-flow Doppler.  Agitated saline contrast bubble study also was positive with mild shunting observed within 3-6 cardiac cycles.  It was felt that this was due to a very small PFO with minimal right to left shunting and that the shunting is intermittent and very  brief and most likely is not the cause to explain her hypoxia/dyspnea.  I last saw her on May 19, 2021 at which time she felt well.  Her blood pressure at home typically runs between [id number] and 0000000 systolically.  She wasunaware of any cardiac arrhythmias.  She was using her BiPAP therapy with supplemental oxygen followed at [facility].  During her evaluation, blood pressure continued to be consistently greater than 130 and I recommended amlodipine titration from 5 mg to 7.5.  At the time she was also taking quinapril 40 mg and triamterene HCT.  She continued be on levothyroxine for hypothyroidism and was on metformin for diabetes mellitus.  She Has tolerated atorvastatin for lipid management.  She was evaluated by Sande Rives, PA-C on December 27, 2021 and was stable from a cardiac standpoint.  She had experienced 1 isolated episode of atypical chest pain not felt to be ischemic.  Presently, she feels well and denies chest pain or shortness of breath.  She continues to use BiPAP with supplemental oxygen followed by Cathlyn Parsons at [place].  She is now seeing Particia Lather, PA for primary care.  She continues to be on amlodipine 7.5 mg, enalapril 40 mg, metoprolol succinate 75 mg 4 and triamterene HCT 37.5/25 mg for blood pressure control.  She continues to be followed by Dr. Freda Jackson of pulmonology and is on Stiolto Respimat and takes as needed albuterol.  She presents for cardiology follow-up evaluation.  Past Medical History:  Diagnosis Date   Diabetes ([place])    Dyslipidemia    Hypothyroidism    Sleep apnea     Surgical history is notable for hysterectomy  Current Medications: Outpatient Medications Prior to Visit  Medication Sig Dispense Refill   albuterol (VENTOLIN HFA) 108 (90 Base) MCG/ACT inhaler Inhale 2 puffs into the lungs every 6 (six) hours as needed for wheezing or shortness of breath. 8 g 6   amLODipine (NORVASC) 5 MG tablet Take 1.5 tablets (7.5 mg total) by mouth daily.  135 tablet 3   atorvastatin (LIPITOR) 20 MG tablet Take 20 mg by mouth daily.     Cyanocobalamin (VITAMIN B-12 PO) Take by mouth.     enalapril (VASOTEC) 20 MG tablet Take 40 mg by mouth daily.     levothyroxine (SYNTHROID, LEVOTHROID) 75 MCG tablet Take 75 mcg by mouth daily before breakfast.     metFORMIN (GLUCOPHAGE) 500 MG tablet Take 500 mg by mouth 2 (two) times daily.     metoprolol succinate (TOPROL-XL) 50 MG 24 hr tablet Take 1.5 tablets (75 mg total) by mouth daily. 135 tablet 3   Multiple Vitamins-Minerals (CENTRUM SILVER) CHEW as directed Orally     Omega-3 Fatty Acids (OMEGA-3 FISH OIL PO) Take by mouth.     Tiotropium Bromide-Olodaterol (STIOLTO RESPIMAT) 2.5-2.5 MCG/ACT AERS Inhale 2 puffs into the lungs daily. 12 g 3   triamterene-hydrochlorothiazide (MAXZIDE-25) 37.5-25 MG tablet Take 1 capsule by mouth daily.     No facility-administered medications prior to visit.     Allergies:  Patient has no known allergies.   Social History   Socioeconomic History   Marital status: Divorced    Spouse name: Not on file   Number of children: Not on file   Years of education: Not on file   Highest education level: Not on file  Occupational History   Not on file  Tobacco Use   Smoking status: Former   Smokeless tobacco: Never  Substance and Sexual Activity   Alcohol use: No   Drug use: Not on file   Sexual activity: Not on file  Other Topics Concern   Not on file  Social History Narrative   Not on file   Social Determinants of Health   Financial Resource Strain: Not on file  Food Insecurity: Not on file  Transportation Needs: Not on file  Physical Activity: Not on file  Stress: Not on file  Social Connections: Not on file    Socially she was born in [place], he has lived in [place] for 6 years.  She is divorced.  She has 1 daughter who who lives in the police department in [place].  Patient is a retired Optometrist.  She had smoked for 30 years  but quit 15 years ago.  She does not routinely exercise.   Family History:  The patient's family history includes Breast cancer (age of onset: 64) in her sister.  Both parents are deceased, mother died at age 25, father had liver cancer and died at age 25.  She has 2 deceased brothers 57 age 52 who had dementia and other age 30 who had thyroid issues.  She has 1 deceased sister who died of cancer at 62.  ROS General: Negative; No fevers, chills, or night sweats; obesity HEENT: Negative; No changes in vision or hearing, sinus congestion, difficulty swallowing Pulmonary: Pulmonary emphysema Cardiovascular: Negative; No chest pain, presyncope, syncope, palpitations GI: Negative; No nausea, vomiting, diarrhea, or abdominal pain GU: Negative; No dysuria, hematuria, or difficulty voiding Musculoskeletal: Negative; no myalgias, joint pain, or weakness Hematologic/Oncology: Negative; no easy bruising, bleeding Endocrine: Negative; no heat/cold intolerance; no diabetes Neuro: Negative; no changes in balance, headaches Skin: Negative; No rashes or skin lesions Psychiatric: Negative; No behavioral problems, depression Sleep: OSA, currently on BiPAP with supplemental O2 Other comprehensive 14 point system review is negative.   PHYSICAL EXAM:   VS:  BP 138/76 (BP Location: Right Arm, Patient Position: Sitting, Cuff Size: Normal)   Pulse 86   Ht 5\' 3"  (1.6 m)   Wt 202 lb 12.8 oz (92 kg)   SpO2 93%   BMI 35.92 kg/m     Repeat blood pressure by me was 118/70  Wt Readings from Last 3 Encounters:  10/06/22 202 lb 12.8 oz (92 kg)  09/13/22 198 lb (89.8 kg)  12/27/21 197 lb 9.6 oz (89.6 kg)    General: Alert, oriented, no distress.  Skin: normal turgor, no rashes, warm and dry HEENT: Normocephalic, atraumatic. Pupils equal round and reactive to light; sclera anicteric; extraocular muscles intact;  Nose without nasal septal hypertrophy Mouth/Parynx benign; Mallinpatti scale 4 Neck: No JVD, no  carotid bruits; normal carotid upstroke Lungs: clear to ausculatation and percussion; no wheezing or rales Chest wall: without tenderness to palpitation Heart: PMI not displaced, RRR, s1 s2 normal, 1/6 systolic murmur, no diastolic murmur, no rubs, gallops, thrills, or heaves Abdomen: soft, nontender; no hepatosplenomehaly, BS+; abdominal aorta nontender and not dilated by palpation. Back: no CVA tenderness Pulses 2+ Musculoskeletal: full range of  motion, normal strength, no joint deformities Extremities: no clubbing cyanosis or edema, Homan's sign negative  Neurologic: grossly nonfocal; Cranial nerves grossly wnl Psychologic: Normal mood and affect     Studies/Labs Reviewed:   October 06, 2022   ECG (independently read by me): NSR at 86, nonspecific ST abnormality  May 19, 2021 ECG (independently read by me): NSR at 74, QS V1-2, No ST changes, no ectopy, normal intervals  February 21, 2021 ECG (independently read by me): NSR at 70, QS V1-2; normal intervals  Recent Labs:    Latest Ref Rng & Units 09/09/2021    8:55 AM 02/21/2021    2:28 PM 01/28/2020   12:00 AM  BMP  Glucose 65 - 99 mg/dL  88    BUN 8 - 27 mg/dL  19  16      Creatinine 0.57 - 1.00 mg/dL  1.24  1.1      BUN/Creat Ratio 12 - 28  15    Sodium 134 - 144 mmol/L  143    Potassium 3.5 - 5.2 mmol/L  4.2    Chloride 96 - 106 mmol/L  101    CO2 20 - 29 mmol/L  22    Calcium 8.6 - 10.4 mg/dL 10.0  10.7       This result is from an external source.        Latest Ref Rng & Units 09/09/2021    8:55 AM 03/11/2021    8:40 AM 04/18/2018    8:19 AM  Hepatic Function  Total Protein 6.0 - 8.3 g/dL   7.7   Albumin 3.5 - 5.2 g/dL   3.9   AST 0 - 37 U/L   20   ALT 0 - 35 U/L   13   Alk Phosphatase 39 - 117 U/L 165  164  143   Total Bilirubin 0.2 - 1.2 mg/dL   0.7   Bilirubin, Direct 0.0 - 0.3 mg/dL   0.1        Latest Ref Rng & Units 02/21/2021    2:28 PM  CBC  WBC 3.4 - 10.8 x10E3/uL 9.1   Hemoglobin 11.1 - 15.9 g/dL  13.8   Hematocrit 34.0 - 46.6 % 46.2   Platelets 150 - 450 x10E3/uL 276    Lab Results  Component Value Date   MCV 73 (L) 02/21/2021   Lab Results  Component Value Date   TSH 1.13 09/09/2021   Lab Results  Component Value Date   HGBA1C 6.8 01/28/2020     BNP No results found for: "BNP"  ProBNP No results found for: "PROBNP"   Lipid Panel  No results found for: "CHOL", "TRIG", "HDL", "CHOLHDL", "VLDL", "LDLCALC", "LDLDIRECT", "LABVLDL"   RADIOLOGY: No results found.   Additional studies/ records that were reviewed today include:  I personally reviewed the records of [place], laboratory from the normal triad, patient's BiPAP titration study by Dr. Nehemiah Settle.  ECHO: 10/22/2020 IMPRESSIONS   1. Left ventricular ejection fraction, by estimation, is 55 to 60%. The  left ventricle has normal function. The left ventricle has no regional  wall motion abnormalities. Left ventricular diastolic parameters are  indeterminate.   2. Right ventricular systolic function is normal. The right ventricular  size is normal. There is mildly elevated pulmonary artery systolic  pressure.   3. Left atrial size was mildly dilated.   4. Right atrial size was mildly dilated.   5. The mitral valve is normal in structure. Trivial mitral valve  regurgitation.  No evidence of mitral stenosis.   6. The aortic valve is tricuspid. There is mild calcification of the  aortic valve. Aortic valve regurgitation is not visualized. Mild aortic  valve sclerosis is present, with no evidence of aortic valve stenosis.   7. Possible shunting seen.    ECHO: 03/01/2021 IMPRESSIONS   1. Left ventricular ejection fraction, by estimation, is 65 to 70%. The  left ventricle has normal function. The left ventricle has no regional  wall motion abnormalities.   2. Right ventricular systolic function is normal. The right ventricular  size is normal.   3. No left atrial/left atrial appendage thrombus was detected.    4. The mitral valve is normal in structure. Mild mitral valve  regurgitation.   5. The aortic valve is tricuspid. Aortic valve regurgitation is not  visualized. No aortic stenosis is present.   6. Evidence of atrial level shunting detected by color flow Doppler.  Agitated saline contrast bubble study was positive with shunting observed  within 3-6 cardiac cycles suggestive of interatrial shunt.   FINDINGS   Left Ventricle: Left ventricular ejection fraction, by estimation, is 65  to 70%. The left ventricle has normal function. The left ventricle has no  regional wall motion abnormalities. The left ventricular internal cavity  size was normal in size.   Right Ventricle: The right ventricular size is normal. Right vetricular  wall thickness was not well visualized. Right ventricular systolic  function is normal.   Left Atrium: Left atrial size was normal in size. No left atrial/left  atrial appendage thrombus was detected.   Right Atrium: Right atrial size was normal in size.   Pericardium: Trivial pericardial effusion is present.   Mitral Valve: The mitral valve is normal in structure. Mild mitral valve  regurgitation.   Tricuspid Valve: The tricuspid valve is normal in structure. Tricuspid  valve regurgitation is not demonstrated.   Aortic Valve: The aortic valve is tricuspid. Aortic valve regurgitation is  not visualized. No aortic stenosis is present.   Pulmonic Valve: The pulmonic valve was grossly normal. Pulmonic valve  regurgitation is not visualized.   Aorta: The aortic root and ascending aorta are structurally normal, with  no evidence of dilitation.   IAS/Shunts: Evidence of atrial level shunting detected by color flow  Doppler. Agitated saline contrast was given intravenously to evaluate for  intracardiac shunting. Agitated saline contrast bubble study was positive  with shunting observed within 3-6  cardiac cycles suggestive of interatrial shunt. There is a  very small PFO  with minimal right to left shunting. The shunting is intermittant and very  brief and likely does not explain her hypoxemia / dyspnea.   ASSESSMENT:    1. Essential hypertension   2. OSA (obstructive sleep apnea)   3. Small PFO   4. Pulmonary emphysema, unspecified emphysema type ([place])   5. Type 2 diabetes mellitus with complication, without long-term current use of insulin ([place])   6. Hypothyroidism, unspecified type     PLAN:  Karen Black is a very pleasant 77 year old female who is originally from [place], [place].  She has a previous 35-year history of tobacco use and fortunately quit over 15 years ago.  She has evidence of pulmonary emphysema on CT imaging and has been demonstrated to have mild restrictive and diffusion defect on pulmonary function studies.  She has a longstanding history of obstructive sleep apnea for over 20 years and has had issues with recent significant oxygen desaturation on exertion.  She  has been demonstrated to have significant nocturnal hypoxemia and is now on BiPAP with supplemental oxygenation.  Her degree of hypoxemia on exertion has been felt to be out of proportion to her CT chest findings or PFT findings.  A transthoracic echo Doppler evaluation confirmed normal systolic function without wall motion abnormalities.  Diastolic parameters were indeterminate.  Her right ventricle size and function are normal and she had evidence for mild pulmonary artery systolic pressure.  On her transthoracic echo, concern was raised about possible shunting but this could not be verified.  Since her initial evaluation she underwent a transesophageal echo which verified evidence for a very small PFO interatrial shunt that was intermittent and very brief and not likely to be the cause of her hypoxemia and dyspnea.  Since I last saw her, could not probe was no longer available and apparently she was switched to enalapril by her primary MD.  Her present  medications now include amlodipine 7.5 mg daily, enalapril 40 mg, metoprolol succinate 75 mg in addition to triamterene HCT 37.5/25 mg.  Repeat blood pressure by me was excellent at 118/70.  She is not having any chest pain or shortness of breath.  She continues to use BiPAP with supplemental oxygen followed by Cathlyn Parsons at [facility].  She is followed by Dr. Erin Fulling  for her lung disease and is on Stiolto Respimat and takes as needed albuterol.  Clinically she is stable from a cardiovascular standpoint.  She continues to be on levothyroxine for hypothyroidism and metformin for diabetes.  She is on atorvastatin 20 mg for hyperlipidemia.  I will see her in 1 year for reevaluation or sooner as needed.   Medication Adjustments/Labs and Tests Ordered: Current medicines are reviewed at length with the patient today.  Concerns regarding medicines are outlined above.  Medication changes, Labs and Tests ordered today are listed in the Patient Instructions below. Patient Instructions  Medication Instructions:  Your physician recommends that you continue on your current medications as directed. Please refer to the Current Medication list given to you today.  *If you need a refill on your cardiac medications before your next appointment, please call your pharmacy*.  Follow-Up: At [facility], you and your health needs are our priority.  As part of our continuing mission to provide you with exceptional heart care, we have created designated Provider Care Teams.  These Care Teams include your primary Cardiologist (physician) and Advanced Practice Providers (APPs -  Physician Assistants and Nurse Practitioners) who all work together to provide you with the care you need, when you need it.  We recommend signing up for the patient portal called "MyChart".  Sign up information is provided on this After Visit Summary.  MyChart is used to connect with patients for Virtual Visits (Telemedicine).  Patients are  able to view lab/test results, encounter notes, upcoming appointments, etc.  Non-urgent messages can be sent to your provider as well.   To learn more about what you can do with MyChart, go to [email or web address].    Your next appointment:   12 month(s)  Provider:   Shelva Majestic, MD         Signed, Shelva Majestic, MD  10/08/2022 3:52 PM    [facility] [address], [place], [place], [place]  [address] Phone: [phone]

## 2022-10-06 NOTE — Patient Instructions (Signed)
Medication Instructions:  Your physician recommends that you continue on your current medications as directed. Please refer to the Current Medication list given to you today.  *If you need a refill on your cardiac medications before your next appointment, please call your pharmacy*  Follow-Up: At [facility], you and your health needs are our priority.  As part of our continuing mission to provide you with exceptional heart care, we have created designated Provider Care Teams.  These Care Teams include your primary Cardiologist (physician) and Advanced Practice Providers (APPs -  Physician Assistants and Nurse Practitioners) who all work together to provide you with the care you need, when you need it.  We recommend signing up for the patient portal called "MyChart".  Sign up information is provided on this After Visit Summary.  MyChart is used to connect with patients for Virtual Visits (Telemedicine).  Patients are able to view lab/test results, encounter notes, upcoming appointments, etc.  Non-urgent messages can be sent to your provider as well.   To learn more about what you can do with MyChart, go to [email or web address].    Your next appointment:   12 month(s)  Provider:   Thomas Kelly, MD      

## 2022-10-08 ENCOUNTER — Encounter: Payer: Self-pay | Admitting: Cardiovascular Disease

## 2022-10-13 DIAGNOSIS — J9611 Chronic respiratory failure with hypoxia: Secondary | ICD-10-CM | POA: Diagnosis not present

## 2022-10-23 DIAGNOSIS — N281 Cyst of kidney, acquired: Secondary | ICD-10-CM | POA: Diagnosis not present

## 2022-10-23 DIAGNOSIS — D4102 Neoplasm of uncertain behavior of left kidney: Secondary | ICD-10-CM | POA: Diagnosis not present

## 2022-11-13 DIAGNOSIS — J9611 Chronic respiratory failure with hypoxia: Secondary | ICD-10-CM | POA: Diagnosis not present

## 2022-12-13 ENCOUNTER — Other Ambulatory Visit: Payer: Self-pay | Admitting: Family Medicine

## 2022-12-13 DIAGNOSIS — Z1231 Encounter for screening mammogram for malignant neoplasm of breast: Secondary | ICD-10-CM

## 2022-12-13 DIAGNOSIS — J9611 Chronic respiratory failure with hypoxia: Secondary | ICD-10-CM | POA: Diagnosis not present

## 2022-12-14 DIAGNOSIS — I10 Essential (primary) hypertension: Secondary | ICD-10-CM | POA: Diagnosis not present

## 2022-12-14 DIAGNOSIS — E1122 Type 2 diabetes mellitus with diabetic chronic kidney disease: Secondary | ICD-10-CM | POA: Diagnosis not present

## 2022-12-14 DIAGNOSIS — E785 Hyperlipidemia, unspecified: Secondary | ICD-10-CM | POA: Diagnosis not present

## 2022-12-14 DIAGNOSIS — J439 Emphysema, unspecified: Secondary | ICD-10-CM | POA: Diagnosis not present

## 2022-12-14 DIAGNOSIS — E890 Postprocedural hypothyroidism: Secondary | ICD-10-CM | POA: Diagnosis not present

## 2022-12-14 DIAGNOSIS — N183 Chronic kidney disease, stage 3 unspecified: Secondary | ICD-10-CM | POA: Diagnosis not present

## 2022-12-26 ENCOUNTER — Other Ambulatory Visit: Payer: Self-pay | Admitting: Gastroenterology

## 2023-01-08 ENCOUNTER — Encounter (HOSPITAL_COMMUNITY)
Admission: RE | Disposition: A | Discharge status: Home / Self Care | Payer: Self-pay | Source: Ambulatory Visit | Attending: Gastroenterology

## 2023-01-08 ENCOUNTER — Encounter (HOSPITAL_COMMUNITY): Payer: Self-pay | Admitting: Gastroenterology

## 2023-01-08 ENCOUNTER — Ambulatory Visit (HOSPITAL_COMMUNITY)
Admission: RE | Admit: 2023-01-08 | Discharge: 2023-01-08 | Disposition: A | Discharge status: Home / Self Care | Payer: Medicare Other | Source: Ambulatory Visit | Attending: Gastroenterology | Admitting: Gastroenterology

## 2023-01-08 ENCOUNTER — Ambulatory Visit (HOSPITAL_COMMUNITY): Payer: Medicare Other | Admitting: Certified Registered Nurse Anesthetist

## 2023-01-08 ENCOUNTER — Ambulatory Visit (HOSPITAL_BASED_OUTPATIENT_CLINIC_OR_DEPARTMENT_OTHER): Payer: Medicare Other | Admitting: Certified Registered Nurse Anesthetist

## 2023-01-08 ENCOUNTER — Other Ambulatory Visit: Payer: Self-pay

## 2023-01-08 DIAGNOSIS — K635 Polyp of colon: Secondary | ICD-10-CM | POA: Diagnosis not present

## 2023-01-08 DIAGNOSIS — Z79899 Other long term (current) drug therapy: Secondary | ICD-10-CM | POA: Diagnosis not present

## 2023-01-08 DIAGNOSIS — D122 Benign neoplasm of ascending colon: Secondary | ICD-10-CM | POA: Diagnosis not present

## 2023-01-08 DIAGNOSIS — J449 Chronic obstructive pulmonary disease, unspecified: Secondary | ICD-10-CM | POA: Diagnosis not present

## 2023-01-08 DIAGNOSIS — Z87891 Personal history of nicotine dependence: Secondary | ICD-10-CM | POA: Insufficient documentation

## 2023-01-08 DIAGNOSIS — K640 First degree hemorrhoids: Secondary | ICD-10-CM

## 2023-01-08 DIAGNOSIS — Z7984 Long term (current) use of oral hypoglycemic drugs: Secondary | ICD-10-CM | POA: Insufficient documentation

## 2023-01-08 DIAGNOSIS — Z1211 Encounter for screening for malignant neoplasm of colon: Secondary | ICD-10-CM | POA: Diagnosis not present

## 2023-01-08 DIAGNOSIS — E039 Hypothyroidism, unspecified: Secondary | ICD-10-CM | POA: Insufficient documentation

## 2023-01-08 DIAGNOSIS — Z86010 Personal history of colonic polyps: Secondary | ICD-10-CM

## 2023-01-08 DIAGNOSIS — D124 Benign neoplasm of descending colon: Secondary | ICD-10-CM | POA: Diagnosis not present

## 2023-01-08 DIAGNOSIS — D125 Benign neoplasm of sigmoid colon: Secondary | ICD-10-CM | POA: Insufficient documentation

## 2023-01-08 DIAGNOSIS — I10 Essential (primary) hypertension: Secondary | ICD-10-CM | POA: Insufficient documentation

## 2023-01-08 DIAGNOSIS — E119 Type 2 diabetes mellitus without complications: Secondary | ICD-10-CM | POA: Diagnosis not present

## 2023-01-08 DIAGNOSIS — Z09 Encounter for follow-up examination after completed treatment for conditions other than malignant neoplasm: Secondary | ICD-10-CM | POA: Diagnosis not present

## 2023-01-08 DIAGNOSIS — D126 Benign neoplasm of colon, unspecified: Secondary | ICD-10-CM

## 2023-01-08 HISTORY — PX: BIOPSY: SHX5522

## 2023-01-08 HISTORY — PX: POLYPECTOMY: SHX5525

## 2023-01-08 HISTORY — PX: COLONOSCOPY WITH PROPOFOL: SHX5780

## 2023-01-08 LAB — GLUCOSE, CAPILLARY: Glucose-Capillary: 123 mg/dL — ABNORMAL HIGH (ref 70–99)

## 2023-01-08 SURGERY — COLONOSCOPY WITH PROPOFOL
Anesthesia: Monitor Anesthesia Care

## 2023-01-08 MED ORDER — PROPOFOL 1000 MG/100ML IV EMUL
INTRAVENOUS | Status: AC
  Filled 2023-01-08: qty 100

## 2023-01-08 MED ORDER — PROPOFOL 10 MG/ML IV BOLUS
INTRAVENOUS | Status: DC | PRN
  Administered 2023-01-08: 20 mg via INTRAVENOUS
  Administered 2023-01-08: 30 mg via INTRAVENOUS

## 2023-01-08 MED ORDER — PROPOFOL 500 MG/50ML IV EMUL
INTRAVENOUS | Status: DC | PRN
  Administered 2023-01-08: 125 ug/kg/min via INTRAVENOUS

## 2023-01-08 MED ORDER — PROPOFOL 500 MG/50ML IV EMUL
INTRAVENOUS | Status: AC
  Filled 2023-01-08: qty 400

## 2023-01-08 MED ORDER — LACTATED RINGERS IV SOLN: INTRAVENOUS | Status: DC

## 2023-01-08 MED ORDER — SODIUM CHLORIDE 0.9 % IV SOLN: INTRAVENOUS | Status: DC

## 2023-01-08 SURGICAL SUPPLY — 22 items

## 2023-01-08 NOTE — H&P (Signed)
Date of Initial H&P: 12/28/12  History reviewed, patient examined, no change in status, stable for surgery.

## 2023-01-08 NOTE — Anesthesia Preprocedure Evaluation (Signed)
Anesthesia Evaluation  Patient identified by MRN, date of birth, ID band Patient awake    Reviewed: Allergy & Precautions, NPO status , Patient's Chart, lab work & pertinent test results  History of Anesthesia Complications Negative for: history of anesthetic complications  Airway Mallampati: III  TM Distance: >3 FB Neck ROM: Full    Dental  (+) Dental Advisory Given, Edentulous Upper 4 teeth on the bottom, none are loose:   Pulmonary neg shortness of breath, sleep apnea , COPD, neg recent URI, former smoker   Pulmonary exam normal breath sounds clear to auscultation       Cardiovascular hypertension (amlodipine, metoprolol, triamterene-HCTZ, enalapril), Pt. on medications and Pt. on home beta blockers (-) angina (-) Past MI, (-) Cardiac Stents and (-) CABG (-) dysrhythmias  Rhythm:Regular Rate:Normal  HLD   Neuro/Psych negative neurological ROS     GI/Hepatic Neg liver ROS, Bowel prep,,,  Endo/Other  diabetes, Type 2, Oral Hypoglycemic AgentsHypothyroidism    Renal/GU negative Renal ROS     Musculoskeletal Paget's disease   Abdominal  (+) + obese  Peds  Hematology negative hematology ROS (+)   Anesthesia Other Findings   Reproductive/Obstetrics                             Anesthesia Physical Anesthesia Plan  ASA: 2  Anesthesia Plan: MAC   Post-op Pain Management: Minimal or no pain anticipated   Induction: Intravenous  PONV Risk Score and Plan: 2 and Propofol infusion and Treatment may vary due to age or medical condition  Airway Management Planned: Natural Airway and Nasal Cannula  Additional Equipment:   Intra-op Plan:   Post-operative Plan:   Informed Consent: I have reviewed the patients History and Physical, chart, labs and discussed the procedure including the risks, benefits and alternatives for the proposed anesthesia with the patient or authorized representative who  has indicated his/her understanding and acceptance.     Dental advisory given  Plan Discussed with: CRNA and Anesthesiologist  Anesthesia Plan Comments: (Discussed with patient risks of MAC including, but not limited to, minor pain or discomfort, hearing people in the room, and possible need for backup general anesthesia. Risks for general anesthesia also discussed including, but not limited to, sore throat, hoarse voice, chipped/damaged teeth, injury to vocal cords, nausea and vomiting, allergic reactions, lung infection, heart attack, stroke, and death. All questions answered. )       Anesthesia Quick Evaluation

## 2023-01-08 NOTE — Discharge Instructions (Addendum)
YOU HAD AN ENDOSCOPIC PROCEDURE TODAY: Refer to the procedure report and other information in the discharge instructions given to you for any specific questions about what was found during the examination. If this information does not answer your questions, please call [facility] office at [phone] to clarify.   YOU SHOULD EXPECT: Some feelings of bloating in the abdomen. Passage of more gas than usual. Walking can help get rid of the air that was put into your GI tract during the procedure and reduce the bloating. If you had a lower endoscopy (such as a colonoscopy or flexible sigmoidoscopy) you may notice spotting of blood in your stool or on the toilet paper. Some abdominal soreness may be present for a day or two, also.  DIET: Your first meal following the procedure should be a light meal and then it is ok to progress to your normal diet. A half-sandwich or bowl of soup is an example of a good first meal. Heavy or fried foods are harder to digest and may make you feel nauseous or bloated. Drink plenty of fluids but you should avoid alcoholic beverages for 24 hours. If you had a esophageal dilation, please see attached instructions for diet.    ACTIVITY: Your care partner should take you home directly after the procedure. You should plan to take it easy, moving slowly for the rest of the day. You can resume normal activity the day after the procedure however YOU SHOULD NOT DRIVE, use power tools, machinery or perform tasks that involve climbing or major physical exertion for 24 hours (because of the sedation medicines used during the test).   SYMPTOMS TO REPORT IMMEDIATELY: A gastroenterologist can be reached at any hour. Please call [phone]  for any of the following symptoms:  Following lower endoscopy (colonoscopy, flexible sigmoidoscopy) Excessive amounts of blood in the stool  Significant tenderness, worsening of abdominal pains  Swelling of the abdomen that is new, acute  Fever of 100  or higher   FOLLOW UP:  If any biopsies were taken you will be contacted by phone or by letter within the next 1-3 weeks. Call [phone]  if you have not heard about the biopsies in 3 weeks.  Please also call with any specific questions about appointments or follow up tests.  PER DR. Bosie Clos instructions- no Advil or NSAIDS for the next 7days.

## 2023-01-08 NOTE — Anesthesia Postprocedure Evaluation (Signed)
Anesthesia Post Note  Patient: Karen Black  Procedure(s) Performed: COLONOSCOPY WITH PROPOFOL HOT HEMOSTASIS (ARGON PLASMA COAGULATION/BICAP) POLYPECTOMY     Patient location during evaluation: PACU Anesthesia Type: MAC Level of consciousness: awake Pain management: pain level controlled Vital Signs Assessment: post-procedure vital signs reviewed and stable Respiratory status: spontaneous breathing, nonlabored ventilation and respiratory function stable Cardiovascular status: stable and blood pressure returned to baseline Postop Assessment: no apparent nausea or vomiting Anesthetic complications: no   No notable events documented.  Last Vitals:  Vitals:   01/08/23 1020 01/08/23 1030  BP: 110/61 124/66  Pulse: 63 60  Resp: 12 20  Temp: (!) 36 C 36.4 C  SpO2: 97% 91%    Last Pain:  Vitals:   01/08/23 1030  TempSrc: Oral  PainSc: 0-No pain                 Linton Rump

## 2023-01-08 NOTE — Interval H&P Note (Signed)
History and Physical Interval Note:  01/08/2023 9:22 AM  Karen Black  has presented today for surgery, with the diagnosis of History of colon polyps.  The various methods of treatment have been discussed with the patient and family. After consideration of risks, benefits and other options for treatment, the patient has consented to  Procedure(s): COLONOSCOPY WITH PROPOFOL (N/A) as a surgical intervention.  The patient's history has been reviewed, patient examined, no change in status, stable for surgery.  I have reviewed the patient's chart and labs.  Questions were answered to the patient's satisfaction.     [name]

## 2023-01-08 NOTE — Op Note (Signed)
[facility] Patient Name: Karen Black Procedure Date: 01/08/2023 MRN: [id number] Attending MD: [name] , MD, [id number] Date of Birth: 09-24-1945 CSN: [id number] Age: 77 Admit Type: Outpatient Procedure:                Colonoscopy Indications:              High risk colon cancer surveillance: Personal                            history of colonic polyps, Last colonoscopy: March                            2019 Providers:                [name], MD, Marge Duncans, RN, Adin Hector, RN, Salley Scarlet, Technician, Toula Moos, Technician Referring MD:              Medicines:                Propofol per Anesthesia, Monitored Anesthesia Care Complications:            No immediate complications. Estimated Blood Loss:     Estimated blood loss was minimal. Procedure:                Pre-Anesthesia Assessment:                           - Prior to the procedure, a History and Physical                            was performed, and patient medications and                            allergies were reviewed. The patient's tolerance of                            previous anesthesia was also reviewed. The risks                            and benefits of the procedure and the sedation                            options and risks were discussed with the patient.                            All questions were answered, and informed consent                            was obtained. Prior Anticoagulants: The patient has  taken no anticoagulant or antiplatelet agents. ASA                            Grade Assessment: II - A patient with mild systemic                            disease. After reviewing the risks and benefits,                            the patient was deemed in satisfactory condition to                            undergo the procedure.                           After  obtaining informed consent, the colonoscope                            was passed under direct vision. Throughout the                            procedure, the patient's blood pressure, pulse, and                            oxygen saturations were monitored continuously. The                            [id number]-HQ190L ([id number]) Olympus colonoscope was                            introduced through the anus and advanced to the the                            cecum, identified by appendiceal orifice and                            ileocecal valve. The colonoscopy was performed                            without difficulty. The patient tolerated the                            procedure well. The quality of the bowel                            preparation was adequate and good. The terminal                            ileum, ileocecal valve, appendiceal orifice, and                            rectum were photographed. Scope In: 9:28:04 AM Scope Out: 9:59:59 AM Scope Withdrawal Time: 0 hours 27 minutes 4 seconds  Total Procedure Duration: 0 hours 31 minutes 55  seconds  Findings:      The perianal and digital rectal examinations were normal.      An 8 mm polyp was found in the ascending colon. The polyp was       semi-sessile. The polyp was removed with a hot snare. Resection and       retrieval were complete. Estimated blood loss was minimal.      Three semi-sessile polyps were found in the descending colon. The polyps       were 1 to 2 mm in size. These polyps were removed with a cold biopsy       forceps. Resection and retrieval were complete. Estimated blood loss was       minimal.      A 4 mm polyp was found in the sigmoid colon. The polyp was semi-sessile.       The polyp was removed with a cold biopsy forceps. Resection and       retrieval were complete. Estimated blood loss was minimal.      A 12 mm polyp was found in the descending colon. The polyp was sessile.       The polyp was removed with  a hot snare. Resection and retrieval were       complete. Estimated blood loss: none.      A 3 mm polyp was found in the descending colon. The polyp was sessile.       The polyp was removed with a cold biopsy forceps. Resection and       retrieval were complete. Estimated blood loss was minimal.      Internal hemorrhoids were found during retroflexion. The hemorrhoids       were medium-sized and Grade I (internal hemorrhoids that do not       prolapse).      The terminal ileum appeared normal. Impression:               - One 8 mm polyp in the ascending colon, removed                            with a hot snare. Resected and retrieved.                           - Three 1 to 2 mm polyps in the descending colon,                            removed with a cold biopsy forceps. Resected and                            retrieved.                           - One 4 mm polyp in the sigmoid colon, removed with                            a cold biopsy forceps. Resected and retrieved.                           - One 12 mm polyp in the descending colon, removed  with a hot snare. Resected and retrieved.                           - One 3 mm polyp in the descending colon, removed                            with a cold biopsy forceps. Resected and retrieved.                           - Internal hemorrhoids.                           - The examined portion of the ileum was normal. Moderate Sedation:      N/A - MAC procedure Recommendation:           - Patient has a contact number available for                            emergencies. The signs and symptoms of potential                            delayed complications were discussed with the                            patient. Return to normal activities tomorrow.                            Written discharge instructions were provided to the                            patient.                           - High fiber diet.                            - Await pathology results.                           - Repeat colonoscopy for surveillance based on                            pathology results.                           - No ibuprofen, naproxen, or other non-steroidal                            anti-inflammatory drugs for 1 week. Procedure Code(s):        --- Professional ---                           [id number], Colonoscopy, flexible; with removal of                            tumor(s), polyp(s), or other lesion(s) by snare  technique                           45380, 59, Colonoscopy, flexible; with biopsy,                            single or multiple Diagnosis Code(s):        --- Professional ---                           Z86.010, Personal history of colonic polyps                           D12.2, Benign neoplasm of ascending colon                           D12.5, Benign neoplasm of sigmoid colon                           D12.4, Benign neoplasm of descending colon                           K64.0, First degree hemorrhoids CPT copyright 2022 American Medical Association. All rights reserved. The codes documented in this report are preliminary and upon coder review may  be revised to meet current compliance requirements. [name], MD 01/08/2023 10:14:15 AM This report has been signed electronically. Number of Addenda: 0

## 2023-01-08 NOTE — Transfer of Care (Signed)
Immediate Anesthesia Transfer of Care Note  Patient: Karen Black  Procedure(s) Performed: COLONOSCOPY WITH PROPOFOL HOT HEMOSTASIS (ARGON PLASMA COAGULATION/BICAP) POLYPECTOMY  Patient Location: Endoscopy Unit  Anesthesia Type:MAC  Level of Consciousness: drowsy and patient cooperative  Airway & Oxygen Therapy: Patient Spontanous Breathing and Patient connected to face mask oxygen  Post-op Assessment: Report given to RN and Post -op Vital signs reviewed and stable  Post vital signs: Reviewed and stable  Last Vitals:  Vitals Value Taken Time  BP 131/78   Temp    Pulse 66 01/08/23 1005  Resp 16 01/08/23 1005  SpO2 91 % 01/08/23 1005  Vitals shown include unvalidated device data.  Last Pain:  Vitals:   01/08/23 0801  TempSrc: Temporal  PainSc: 0-No pain         Complications: No notable events documented.

## 2023-01-09 LAB — SURGICAL PATHOLOGY

## 2023-01-11 ENCOUNTER — Encounter (HOSPITAL_COMMUNITY): Payer: Self-pay | Admitting: Gastroenterology

## 2023-01-13 DIAGNOSIS — J9611 Chronic respiratory failure with hypoxia: Secondary | ICD-10-CM | POA: Diagnosis not present

## 2023-01-15 DIAGNOSIS — G4733 Obstructive sleep apnea (adult) (pediatric): Secondary | ICD-10-CM | POA: Diagnosis not present

## 2023-01-24 ENCOUNTER — Ambulatory Visit
Admission: RE | Admit: 2023-01-24 | Discharge: 2023-01-24 | Disposition: A | Discharge status: Home / Self Care | Payer: Medicare Other | Source: Ambulatory Visit | Attending: Family Medicine | Admitting: Family Medicine

## 2023-01-24 DIAGNOSIS — Z1231 Encounter for screening mammogram for malignant neoplasm of breast: Secondary | ICD-10-CM | POA: Diagnosis not present

## 2023-02-12 DIAGNOSIS — J9611 Chronic respiratory failure with hypoxia: Secondary | ICD-10-CM | POA: Diagnosis not present

## 2023-03-06 DIAGNOSIS — Z9989 Dependence on other enabling machines and devices: Secondary | ICD-10-CM | POA: Diagnosis not present

## 2023-03-06 DIAGNOSIS — Z9981 Dependence on supplemental oxygen: Secondary | ICD-10-CM | POA: Diagnosis not present

## 2023-03-06 DIAGNOSIS — J069 Acute upper respiratory infection, unspecified: Secondary | ICD-10-CM | POA: Diagnosis not present

## 2023-03-06 DIAGNOSIS — N1832 Chronic kidney disease, stage 3b: Secondary | ICD-10-CM | POA: Diagnosis not present

## 2023-03-06 DIAGNOSIS — E1122 Type 2 diabetes mellitus with diabetic chronic kidney disease: Secondary | ICD-10-CM | POA: Diagnosis not present

## 2023-03-15 DIAGNOSIS — J9611 Chronic respiratory failure with hypoxia: Secondary | ICD-10-CM | POA: Diagnosis not present

## 2023-03-23 DIAGNOSIS — E1122 Type 2 diabetes mellitus with diabetic chronic kidney disease: Secondary | ICD-10-CM | POA: Diagnosis not present

## 2023-03-23 DIAGNOSIS — E785 Hyperlipidemia, unspecified: Secondary | ICD-10-CM | POA: Diagnosis not present

## 2023-04-04 DIAGNOSIS — J439 Emphysema, unspecified: Secondary | ICD-10-CM | POA: Diagnosis not present

## 2023-04-04 DIAGNOSIS — Z9981 Dependence on supplemental oxygen: Secondary | ICD-10-CM | POA: Diagnosis not present

## 2023-04-04 DIAGNOSIS — J9611 Chronic respiratory failure with hypoxia: Secondary | ICD-10-CM | POA: Diagnosis not present

## 2023-04-04 DIAGNOSIS — G4733 Obstructive sleep apnea (adult) (pediatric): Secondary | ICD-10-CM | POA: Diagnosis not present

## 2023-04-13 DIAGNOSIS — Z961 Presence of intraocular lens: Secondary | ICD-10-CM | POA: Diagnosis not present

## 2023-04-13 DIAGNOSIS — H26491 Other secondary cataract, right eye: Secondary | ICD-10-CM | POA: Diagnosis not present

## 2023-04-13 DIAGNOSIS — H11133 Conjunctival pigmentations, bilateral: Secondary | ICD-10-CM | POA: Diagnosis not present

## 2023-04-13 DIAGNOSIS — H0520 Unspecified exophthalmos: Secondary | ICD-10-CM | POA: Diagnosis not present

## 2023-04-13 DIAGNOSIS — H0102B Squamous blepharitis left eye, upper and lower eyelids: Secondary | ICD-10-CM | POA: Diagnosis not present

## 2023-04-13 DIAGNOSIS — E119 Type 2 diabetes mellitus without complications: Secondary | ICD-10-CM | POA: Diagnosis not present

## 2023-04-13 DIAGNOSIS — H0102A Squamous blepharitis right eye, upper and lower eyelids: Secondary | ICD-10-CM | POA: Diagnosis not present

## 2023-04-13 DIAGNOSIS — H43811 Vitreous degeneration, right eye: Secondary | ICD-10-CM | POA: Diagnosis not present

## 2023-05-14 DIAGNOSIS — N1832 Chronic kidney disease, stage 3b: Secondary | ICD-10-CM | POA: Diagnosis not present

## 2023-05-14 DIAGNOSIS — I10 Essential (primary) hypertension: Secondary | ICD-10-CM | POA: Diagnosis not present

## 2023-05-14 DIAGNOSIS — E559 Vitamin D deficiency, unspecified: Secondary | ICD-10-CM | POA: Diagnosis not present

## 2023-05-14 DIAGNOSIS — E890 Postprocedural hypothyroidism: Secondary | ICD-10-CM | POA: Diagnosis not present

## 2023-05-14 DIAGNOSIS — M889 Osteitis deformans of unspecified bone: Secondary | ICD-10-CM | POA: Diagnosis not present

## 2023-06-03 ENCOUNTER — Other Ambulatory Visit: Payer: Self-pay | Admitting: Pulmonary Disease

## 2023-08-02 DIAGNOSIS — M21611 Bunion of right foot: Secondary | ICD-10-CM | POA: Diagnosis not present

## 2023-08-02 DIAGNOSIS — M7751 Other enthesopathy of right foot: Secondary | ICD-10-CM | POA: Diagnosis not present

## 2023-08-02 DIAGNOSIS — M25571 Pain in right ankle and joints of right foot: Secondary | ICD-10-CM | POA: Diagnosis not present

## 2023-08-02 DIAGNOSIS — M79671 Pain in right foot: Secondary | ICD-10-CM | POA: Diagnosis not present

## 2023-08-09 ENCOUNTER — Telehealth (HOSPITAL_COMMUNITY): Payer: Self-pay

## 2023-08-09 ENCOUNTER — Ambulatory Visit: Payer: Medicare Other | Admitting: Pulmonary Disease

## 2023-08-09 ENCOUNTER — Encounter: Payer: Self-pay | Admitting: Pulmonary Disease

## 2023-08-09 VITALS — BP 124/74 | HR 87 | Temp 98.4°F | Ht 64.0 in | Wt 190.4 lb

## 2023-08-09 DIAGNOSIS — J439 Emphysema, unspecified: Secondary | ICD-10-CM | POA: Diagnosis not present

## 2023-08-09 DIAGNOSIS — R0602 Shortness of breath: Secondary | ICD-10-CM

## 2023-08-09 DIAGNOSIS — Z87891 Personal history of nicotine dependence: Secondary | ICD-10-CM | POA: Diagnosis not present

## 2023-08-09 MED ORDER — STIOLTO RESPIMAT 2.5-2.5 MCG/ACT IN AERS
2.0000 | INHALATION_SPRAY | Freq: Every day | RESPIRATORY_TRACT | 4 refills | Status: DC
Start: 2023-08-09 — End: 2023-11-15

## 2023-08-09 MED ORDER — ALBUTEROL SULFATE HFA 108 (90 BASE) MCG/ACT IN AERS
2.0000 | INHALATION_SPRAY | Freq: Four times a day (QID) | RESPIRATORY_TRACT | 1 refills | Status: DC | PRN
Start: 2023-08-09 — End: 2024-03-07

## 2023-08-09 NOTE — Telephone Encounter (Signed)
Received referral from Dr. Francine Graven for this patient to participate in Pulmonary Rehab with the diagnosis of chronic hypoxic respiratory failure with hypoxia. Clinical review performed on patient's follow up appointment on 08/09/23 Pulmonary office note. Patient is appropriate for scheduling for Pulmonary Rehab. RN will forward to support staff for scheduling and verification of insurance eligibility/benefits with patient consent.

## 2023-08-09 NOTE — Progress Notes (Unsigned)
Synopsis: Referred in March 2022 by Kandice Robinsons, NP for low oxygen saturations  Subjective:   PATIENT ID: Karen Black GENDER: female DOB: 07-14-1946, MRN: [id number]  HPI  Chief Complaint  Patient presents with  . Follow-up    Follow up for emphysema   Kaelee Pfeffer is a 78 year old woman, former smoker with diabetes and sleep apnea on CPAP who returns to pulmonary clinic for chronic respiratory failure.   They report no major issues and continue to use Stiolto without any adverse effects. However, they note a decrease in exercise tolerance, specifically experiencing dyspnea on exertion during prolonged walking. This has led to the use of a scooter for long excursions, such as a visit to the zoo, but it is not required for daily activities or short trips like grocery shopping. [facility] [place] [facility].  The patient has been compliant with their CPAP therapy at night.   OV 09/13/22 She has done well since last visit. She is using Stiolto 1 puff twice daily. She has not required much albuterol as needed. She is using oxygen with ambulation. No other complaints at this time.  OV 09/13/21 She has been doing well since last visit.  She does have portable oxygen concentrator but reports it is very heavy and she does not use it often.  When she exerts herself she takes frequent breaks due to shortness of breath.  She is using CPAP with 3 L supplemental oxygen at night.  She was provided Stiolto inhaler at last visit but did not try this.  She is using albuterol inhaler as needed and reports infrequent use.  Cardiology note from 05/19/2021 reviewed.  She had TEE on 03/01/2021 which showed small right to left interatrial shunting.  Pulmonary function test today show mild restriction and mild diffusion defect.  OV 11/12/20 On simple walk in our office on 10/05/20 she had SpO2 saturations to 77% upon the first lap of walking with rapid return to 95%  SpO2 with rest. She did not wish to be started on supplemental oxygen at that time. She has similar declines in the office again today upon ambulation. Overall her breathing has been stable since last visit. She does notice benefit from the albuterol inhaler.   ECHO on 10/22/2020 shows normal LVEF and function, normal RV function and size with mildly elevated PA systolic pressure. She has mildly enlarged left and right atria. Unable to perform bubble study due to difficulty obtaining IV access but there is reported possible shunting on this TTE study.  Pulmonary function tests today show mild restrictive and diffusion defects.   I spoke with Carilyn Goodpasture PA at [facility] after the last visit about having her sleep apnea re-assessed for concern of needing supplemental oxygen with her CPAP machine. Patient reports she is scheduled for an in lab sleep study in July.   She reports she will be flying to [facility] soon to watch the promotion of her daughter who is in the [employer].  Past Medical History:  Diagnosis Date  . Diabetes (HCC)   . Dyslipidemia   . Hypothyroidism   . Sleep apnea      Family History  Problem Relation Age of Onset  . Breast cancer Sister 44     Social History   Socioeconomic History  . Marital status: Divorced    Spouse name: Not on file  . Number of children: Not on file  . Years of  education: Not on file  . Highest education level: Not on file  Occupational History  . Not on file  Tobacco Use  . Smoking status: Former  . Smokeless tobacco: Never  Substance and Sexual Activity  . Alcohol use: No  . Drug use: Not on file  . Sexual activity: Not on file  Other Topics Concern  . Not on file  Social History Narrative  . Not on file   Social Drivers of Health   Financial Resource Strain: Not on file  Food Insecurity: Not on file  Transportation Needs: Not on file  Physical Activity: Not on file  Stress: Not on file  Social  Connections: Unknown (11/14/2021)   Received from [facility], [facility]   Social Network   . Social Network: Not on file  Intimate Partner Violence: Unknown (10/21/2021)   Received from [facility], [facility]   HITS   . Physically Hurt: Not on file   . Insult or Talk Down To: Not on file   . Threaten Physical Harm: Not on file   . Scream or Curse: Not on file     No Known Allergies   Outpatient Medications Prior to Visit  Medication Sig Dispense Refill  . amLODipine (NORVASC) 10 MG tablet Take 10 mg by mouth in the morning.    Marland Kitchen atorvastatin (LIPITOR) 20 MG tablet Take 20 mg by mouth in the morning.    . Cyanocobalamin (VITAMIN B-12 PO) Take 1,000 mcg by mouth in the morning.    . enalapril (VASOTEC) 20 MG tablet Take 40 mg by mouth in the morning.    Marland Kitchen levothyroxine (SYNTHROID, LEVOTHROID) 75 MCG tablet Take 75 mcg by mouth daily before breakfast.    . metFORMIN (GLUCOPHAGE) 500 MG tablet Take 1,000 mg by mouth daily with breakfast.    . metoprolol succinate (TOPROL-XL) 50 MG 24 hr tablet Take 1.5 tablets (75 mg total) by mouth daily. 135 tablet 3  . Multiple Vitamins-Minerals (CENTRUM SILVER) CHEW Chew 1 tablet by mouth in the morning.    . Omega-3 Fatty Acids (OMEGA-3 FISH OIL PO) Take 1,000 mg by mouth in the morning.    . triamterene-hydrochlorothiazide (MAXZIDE-25) 37.5-25 MG tablet Take 1 tablet by mouth in the morning.    Marland Kitchen albuterol (VENTOLIN HFA) 108 (90 Base) MCG/ACT inhaler Inhale 2 puffs into the lungs every 6 (six) hours as needed for wheezing or shortness of breath. 8 g 6  . STIOLTO RESPIMAT 2.5-2.5 MCG/ACT AERS USE 2 INHALATIONS BY MOUTH DAILY 12 g 3   No facility-administered medications prior to visit.    Review of Systems  Constitutional:  Negative for chills, fever, malaise/fatigue and weight loss.  HENT:  Negative for congestion, sinus pain and sore throat.   Eyes: Negative.   Respiratory:  Positive for shortness of breath. Negative for cough,  hemoptysis, sputum production and wheezing.   Cardiovascular:  Negative for chest pain, palpitations, orthopnea, claudication and leg swelling.  Gastrointestinal:  Negative for abdominal pain, heartburn, nausea and vomiting.  Genitourinary: Negative.   Musculoskeletal:  Negative for joint pain and myalgias.  Skin:  Negative for rash.  Neurological:  Negative for weakness.  Endo/Heme/Allergies: Negative.   Psychiatric/Behavioral: Negative.      Objective:   Vitals:   08/09/23 1324  BP: 124/74  Pulse: 87  Temp: 98.4 F (36.9 C)  TempSrc: Temporal  SpO2: 93%  Weight: 190 lb 6.4 oz (86.4 kg)  Height: 5\' 4"  (1.626 m)   Physical Exam Constitutional:  General: She is not in acute distress.    Appearance: She is obese. She is not ill-appearing.  HENT:     Head: Normocephalic and atraumatic.  Eyes:     General: No scleral icterus.    Conjunctiva/sclera: Conjunctivae normal.  Cardiovascular:     Rate and Rhythm: Normal rate and regular rhythm.     Pulses: Normal pulses.     Heart sounds: Normal heart sounds. No murmur heard. Pulmonary:     Effort: Pulmonary effort is normal.     Breath sounds: Normal breath sounds. No wheezing, rhonchi or rales.  Musculoskeletal:     Right lower leg: No edema.     Left lower leg: No edema.  Skin:    General: Skin is warm and dry.  Neurological:     General: No focal deficit present.     Mental Status: She is alert.   CBC    Component Value Date/Time   WBC 9.1 02/21/2021 1428   RBC 6.30 (H) 02/21/2021 1428   HGB 13.8 02/21/2021 1428   HCT 46.2 02/21/2021 1428   PLT 276 02/21/2021 1428   MCV 73 (L) 02/21/2021 1428   MCH 21.9 (L) 02/21/2021 1428   MCHC 29.9 (L) 02/21/2021 1428   RDW 14.9 02/21/2021 1428   Chest imaging: CT Chest Lung Cancer Screening 09/15/20 1. Lung-RADS 2s, benign appearance or behavior. Continue annual screening with low-dose chest CT without contrast in 12 months.  Mediastinum/Nodes: Normal appearance of  the thyroid gland. The trachea appears patent and is midline. Normal appearance of the esophagus. No enlarged lymph nodes.   Lungs/Pleura: Mild changes of centrilobular and paraseptal emphysema. Scarring is noted within both lung bases. Several tiny nodules are identified. The largest is in the superior segment of left lower lobe with an equivalent diameter of 3.9 mm. No suspicious lung nodules identified at this time.  There is a conglomeration of multiple large complicated cyst arising off the upper pole of the left kidney. This is incompletely characterized without IV contrast material  PFT:    Latest Ref Rng & Units 11/12/2020   10:27 AM  PFT Results  FVC-Pre L 1.81   FVC-Predicted Pre % 82   FVC-Post L 1.98   FVC-Predicted Post % 89   Pre FEV1/FVC % % 83   Post FEV1/FCV % % 83   FEV1-Pre L 1.50   FEV1-Predicted Pre % 88   FEV1-Post L 1.65   DLCO uncorrected ml/min/mmHg 14.03   DLCO UNC% % 73   DLCO corrected ml/min/mmHg 14.03   DLCO COR %Predicted % 73   DLVA Predicted % 107   TLC L 3.58   TLC % Predicted % 70   RV % Predicted % 71   PFTs 2023 mild restriction and mild diffusion defect     Assessment & Plan:   Pulmonary emphysema, unspecified emphysema type (HCC) - Plan: Tiotropium Bromide-Olodaterol (STIOLTO RESPIMAT) 2.5-2.5 MCG/ACT AERS, AMB referral to pulmonary rehabilitation  Shortness of breath - Plan: albuterol (VENTOLIN HFA) 108 (90 Base) MCG/ACT inhaler  Former smoker - Plan: CANCELED: Ambulatory Referral for Lung Cancer Scre  Discussion: Arlana Canizales is a 78 year old woman, former smoker with diabetes and sleep apnea on CPAP who returns to pulmonary clinic for chronic hypoxemic respiratory failure and emphysema.   We will message our lung cancer screening team for her to get an updated scan.   She is to continue supplemental oxygen with exertion.   She is to continue Stiolto 2 puffs daily  instead of 1 puff twice daily. She can continue albuterol  as needed.  Follow-up in 1 year.  Melody Comas, MD [facility] Office: [phone]   Current Outpatient Medications:  .  amLODipine (NORVASC) 10 MG tablet, Take 10 mg by mouth in the morning., Disp: , Rfl:  .  atorvastatin (LIPITOR) 20 MG tablet, Take 20 mg by mouth in the morning., Disp: , Rfl:  .  Cyanocobalamin (VITAMIN B-12 PO), Take 1,000 mcg by mouth in the morning., Disp: , Rfl:  .  enalapril (VASOTEC) 20 MG tablet, Take 40 mg by mouth in the morning., Disp: , Rfl:  .  levothyroxine (SYNTHROID, LEVOTHROID) 75 MCG tablet, Take 75 mcg by mouth daily before breakfast., Disp: , Rfl:  .  metFORMIN (GLUCOPHAGE) 500 MG tablet, Take 1,000 mg by mouth daily with breakfast., Disp: , Rfl:  .  metoprolol succinate (TOPROL-XL) 50 MG 24 hr tablet, Take 1.5 tablets (75 mg total) by mouth daily., Disp: 135 tablet, Rfl: 3 .  Multiple Vitamins-Minerals (CENTRUM SILVER) CHEW, Chew 1 tablet by mouth in the morning., Disp: , Rfl:  .  Omega-3 Fatty Acids (OMEGA-3 FISH OIL PO), Take 1,000 mg by mouth in the morning., Disp: , Rfl:  .  triamterene-hydrochlorothiazide (MAXZIDE-25) 37.5-25 MG tablet, Take 1 tablet by mouth in the morning., Disp: , Rfl:  .  albuterol (VENTOLIN HFA) 108 (90 Base) MCG/ACT inhaler, Inhale 2 puffs into the lungs every 6 (six) hours as needed for wheezing or shortness of breath., Disp: 8 g, Rfl: 1 .  Tiotropium Bromide-Olodaterol (STIOLTO RESPIMAT) 2.5-2.5 MCG/ACT AERS, Inhale 2 puffs into the lungs daily., Disp: 12 g, Rfl: 4

## 2023-08-09 NOTE — Patient Instructions (Addendum)
Continue stiolto 2 puffs daily  Use albuterol inhaler 1-2 puffs every 4-6 hours as needed  I have placed a referral to our pulmonary rehab program  Follow up in 1 year, call sooner if needed

## 2023-08-09 NOTE — Telephone Encounter (Signed)
Called patient to see if she was interested in participating in the Pulmonary Rehab Program. Patient stated yes. Patient will come in for orientation on Wednesday 08/15/23 and will attend the 1:15pm exercise class.  Pensions consultant.

## 2023-08-10 ENCOUNTER — Telehealth (HOSPITAL_COMMUNITY): Payer: Self-pay

## 2023-08-10 NOTE — Telephone Encounter (Signed)
Pt insurance is active and benefits verified through [facility]. Co-pay $15.00, DED $0.00/$0.00 met, out of pocket $4,900.00/$0.00 met, co-insurance 0%. No pre-authorization required. Mavi D./[employer], 08/10/23 @ 10:55AM, UXL#[id number]

## 2023-08-11 ENCOUNTER — Encounter: Payer: Self-pay | Admitting: Pulmonary Disease

## 2023-08-15 ENCOUNTER — Telehealth (HOSPITAL_COMMUNITY): Payer: Self-pay

## 2023-08-15 ENCOUNTER — Inpatient Hospital Stay (HOSPITAL_COMMUNITY): Admission: RE | Admit: 2023-08-15 | Payer: Medicare Other | Source: Ambulatory Visit

## 2023-08-15 NOTE — Telephone Encounter (Signed)
Karen Black attended [employer] orientation today. Insurance benefits verified by office staff. Patient has a $15 co-pay per class. Unable to afford right now as she is paying off a new water heater. Pt stated she will return after the water heater is paid off.

## 2023-08-17 DIAGNOSIS — M21611 Bunion of right foot: Secondary | ICD-10-CM | POA: Diagnosis not present

## 2023-08-21 ENCOUNTER — Ambulatory Visit (HOSPITAL_COMMUNITY): Payer: Medicare Other

## 2023-08-23 ENCOUNTER — Ambulatory Visit (HOSPITAL_COMMUNITY): Payer: Medicare Other

## 2023-08-27 ENCOUNTER — Other Ambulatory Visit: Payer: Self-pay | Admitting: Urology

## 2023-08-27 DIAGNOSIS — N281 Cyst of kidney, acquired: Secondary | ICD-10-CM

## 2023-08-28 ENCOUNTER — Ambulatory Visit (HOSPITAL_COMMUNITY): Payer: Medicare Other

## 2023-08-30 ENCOUNTER — Ambulatory Visit (HOSPITAL_COMMUNITY): Payer: Medicare Other

## 2023-09-04 ENCOUNTER — Other Ambulatory Visit: Payer: Medicare Other

## 2023-09-04 ENCOUNTER — Ambulatory Visit (HOSPITAL_COMMUNITY): Payer: Medicare Other

## 2023-09-06 ENCOUNTER — Ambulatory Visit (HOSPITAL_COMMUNITY): Payer: Medicare Other

## 2023-09-11 ENCOUNTER — Ambulatory Visit (HOSPITAL_COMMUNITY): Payer: Medicare Other

## 2023-09-11 DIAGNOSIS — G4733 Obstructive sleep apnea (adult) (pediatric): Secondary | ICD-10-CM | POA: Diagnosis not present

## 2023-09-13 ENCOUNTER — Ambulatory Visit (HOSPITAL_COMMUNITY): Payer: Medicare Other

## 2023-09-18 ENCOUNTER — Ambulatory Visit (HOSPITAL_COMMUNITY): Payer: Medicare Other

## 2023-09-19 DIAGNOSIS — I10 Essential (primary) hypertension: Secondary | ICD-10-CM | POA: Diagnosis not present

## 2023-09-19 DIAGNOSIS — E785 Hyperlipidemia, unspecified: Secondary | ICD-10-CM | POA: Diagnosis not present

## 2023-09-19 DIAGNOSIS — E890 Postprocedural hypothyroidism: Secondary | ICD-10-CM | POA: Diagnosis not present

## 2023-09-19 DIAGNOSIS — J439 Emphysema, unspecified: Secondary | ICD-10-CM | POA: Diagnosis not present

## 2023-09-19 DIAGNOSIS — Z0000 Encounter for general adult medical examination without abnormal findings: Secondary | ICD-10-CM | POA: Diagnosis not present

## 2023-09-19 DIAGNOSIS — Z79899 Other long term (current) drug therapy: Secondary | ICD-10-CM | POA: Diagnosis not present

## 2023-09-19 DIAGNOSIS — E1169 Type 2 diabetes mellitus with other specified complication: Secondary | ICD-10-CM | POA: Diagnosis not present

## 2023-09-19 DIAGNOSIS — G4733 Obstructive sleep apnea (adult) (pediatric): Secondary | ICD-10-CM | POA: Diagnosis not present

## 2023-09-20 ENCOUNTER — Ambulatory Visit (HOSPITAL_COMMUNITY): Payer: Medicare Other

## 2023-09-20 ENCOUNTER — Other Ambulatory Visit: Payer: Self-pay | Admitting: Family Medicine

## 2023-09-20 DIAGNOSIS — E2839 Other primary ovarian failure: Secondary | ICD-10-CM

## 2023-09-25 ENCOUNTER — Ambulatory Visit (HOSPITAL_COMMUNITY): Payer: Medicare Other

## 2023-09-27 ENCOUNTER — Ambulatory Visit (HOSPITAL_COMMUNITY): Payer: Medicare Other

## 2023-10-02 ENCOUNTER — Ambulatory Visit (HOSPITAL_COMMUNITY): Payer: Medicare Other

## 2023-10-03 ENCOUNTER — Ambulatory Visit
Admission: RE | Admit: 2023-10-03 | Discharge: 2023-10-03 | Disposition: A | Discharge status: Home / Self Care | Source: Ambulatory Visit | Attending: Urology | Admitting: Urology

## 2023-10-03 DIAGNOSIS — N281 Cyst of kidney, acquired: Secondary | ICD-10-CM

## 2023-10-03 MED ORDER — GADOPICLENOL 0.5 MMOL/ML IV SOLN
9.0000 mL | Freq: Once | INTRAVENOUS | Status: AC | PRN
  Administered 2023-10-03: 9 mL via INTRAVENOUS

## 2023-10-04 ENCOUNTER — Other Ambulatory Visit: Payer: Medicare Other

## 2023-10-04 ENCOUNTER — Ambulatory Visit (HOSPITAL_COMMUNITY): Payer: Medicare Other

## 2023-10-09 ENCOUNTER — Ambulatory Visit (HOSPITAL_COMMUNITY): Payer: Medicare Other

## 2023-10-11 ENCOUNTER — Ambulatory Visit (HOSPITAL_COMMUNITY): Payer: Medicare Other

## 2023-10-16 ENCOUNTER — Ambulatory Visit (HOSPITAL_COMMUNITY): Payer: Medicare Other

## 2023-10-16 DIAGNOSIS — D4102 Neoplasm of uncertain behavior of left kidney: Secondary | ICD-10-CM | POA: Diagnosis not present

## 2023-10-16 DIAGNOSIS — N281 Cyst of kidney, acquired: Secondary | ICD-10-CM | POA: Diagnosis not present

## 2023-10-18 ENCOUNTER — Ambulatory Visit (HOSPITAL_COMMUNITY): Payer: Medicare Other

## 2023-10-23 ENCOUNTER — Ambulatory Visit (HOSPITAL_COMMUNITY): Payer: Medicare Other

## 2023-10-23 ENCOUNTER — Other Ambulatory Visit: Payer: Medicare Other

## 2023-10-25 ENCOUNTER — Ambulatory Visit (HOSPITAL_COMMUNITY): Payer: Medicare Other

## 2023-10-30 ENCOUNTER — Ambulatory Visit (HOSPITAL_COMMUNITY): Payer: Medicare Other

## 2023-11-01 ENCOUNTER — Ambulatory Visit (HOSPITAL_COMMUNITY): Payer: Medicare Other

## 2023-11-06 ENCOUNTER — Ambulatory Visit (HOSPITAL_COMMUNITY): Payer: Medicare Other

## 2023-11-08 ENCOUNTER — Ambulatory Visit (HOSPITAL_COMMUNITY): Payer: Medicare Other

## 2023-11-13 DIAGNOSIS — G4733 Obstructive sleep apnea (adult) (pediatric): Secondary | ICD-10-CM | POA: Diagnosis not present

## 2023-11-13 DIAGNOSIS — J439 Emphysema, unspecified: Secondary | ICD-10-CM | POA: Diagnosis not present

## 2023-11-13 DIAGNOSIS — Z9981 Dependence on supplemental oxygen: Secondary | ICD-10-CM | POA: Diagnosis not present

## 2023-11-14 ENCOUNTER — Telehealth: Payer: Self-pay

## 2023-11-14 NOTE — Telephone Encounter (Signed)
 Spoke with [facility] from [facility] regarding patient. Amber stated patient cam in and her O2 was 78% and they had patient put her hands under warm water and checked her O2 3 times until it want to 89%.Patient does use o2 at night 3l. Patient was referred to pulmonary rehab and has canceled her office visit .  ATC x1 patient left a vm for patient to call our office back  Dr.Dewald can you please advise .  Thank you

## 2023-11-14 NOTE — Telephone Encounter (Signed)
 Copied from CRM [id number]. Topic: Clinical - Medical Advice >> Nov 13, 2023 10:28 AM Justina Oman C wrote: Reason for CRM: Amber from [facility] [phone], patient was seen in the office today and her oxygen  is decreasing when first arrived 78% and checked three times after warming hands it was 89%. Patient is having dyspnea on exertion in the last 1-2 months. Patient is not in the office and has gone home. Amber is calling FYI to see if Dr. Deidre Fat needs to re-eval patient. Please advise and call back if needed.  ATC Amber at [facility] LVM for amber to call me back regarding patient.  ATC x1 Patient LVM for patient to call our office back .

## 2023-11-14 NOTE — Telephone Encounter (Signed)
 Ms. Karen Black calling the [facility] returning call for a Ms Karen Black . Called cal and they are sending message to Ms. Karen Black to see if she is available . Transferred call to Ms. Karen Black

## 2023-11-14 NOTE — Telephone Encounter (Signed)
 Routing to front staff please schedule pt

## 2023-11-14 NOTE — Telephone Encounter (Signed)
 Patient should be scheduled for an acute visit to be evaluated for oxygen . Please schedule with me 5/1 in blocked slot or double book if needed.  Thanks, JD

## 2023-11-15 ENCOUNTER — Encounter: Payer: Self-pay | Admitting: Pulmonary Disease

## 2023-11-15 ENCOUNTER — Ambulatory Visit: Admitting: Pulmonary Disease

## 2023-11-15 VITALS — BP 128/70 | HR 90 | Ht 64.0 in | Wt 189.4 lb

## 2023-11-15 DIAGNOSIS — G4733 Obstructive sleep apnea (adult) (pediatric): Secondary | ICD-10-CM

## 2023-11-15 DIAGNOSIS — J9611 Chronic respiratory failure with hypoxia: Secondary | ICD-10-CM | POA: Diagnosis not present

## 2023-11-15 DIAGNOSIS — J439 Emphysema, unspecified: Secondary | ICD-10-CM

## 2023-11-15 MED ORDER — STIOLTO RESPIMAT 2.5-2.5 MCG/ACT IN AERS
2.0000 | INHALATION_SPRAY | Freq: Every day | RESPIRATORY_TRACT | 4 refills | Status: DC
Start: 2023-11-15 — End: 2024-03-07

## 2023-11-15 NOTE — Patient Instructions (Addendum)
 Recommend using 3L pulsed on your portable oxygen  concentrator  Continue CPAP with 3L when sleeping  Continue stiolto 2 puffs daily  Use albuterol  inhaler 1-2 puffs every 4-6 hours for wheezing, cough, shortness of breath or chest tightness  If you are using albuterol  more than 3 times per day on a regular basis we can change your stiolto inhaler to Breztri or Trelegy ellipta  Follow up in 4 months

## 2023-11-15 NOTE — Progress Notes (Signed)
 Synopsis: Referred in March 2022 by Dara Ear, NP for low oxygen  saturations  Subjective:   PATIENT ID: Karen Black GENDER: female DOB: 1945/12/11, MRN: [id number]  HPI  Chief Complaint  Patient presents with   Acute Visit   Karen Black is a 78 year old woman, former smoker with diabetes and sleep apnea on CPAP who returns to pulmonary clinic for chronic respiratory failure.   Patient seen acutely due to low oxygen  levels at her primary care office. She has not been using her supplemental oxygen  with exertion.   OV 09/13/22 They report no major issues and continue to use Stiolto without any adverse effects. However, they note a decrease in exercise tolerance, specifically experiencing dyspnea on exertion during prolonged walking. This has led to the use of a scooter for long excursions, such as a visit to the zoo, but it is not required for daily activities or short trips like grocery shopping. [facility].  The patient has been compliant with their CPAP therapy at night based on report review.   OV 09/13/22 She has done well since last visit. She is using Stiolto 1 puff twice daily. She has not required much albuterol  as needed. She is using oxygen  with ambulation. No other complaints at this time.  OV 09/13/21 She has been doing well since last visit.  She does have portable oxygen  concentrator but reports it is very heavy and she does not use it often.  When she exerts herself she takes frequent breaks due to shortness of breath.  She is using CPAP with 3 L supplemental oxygen  at night.  She was provided Stiolto inhaler at last visit but did not try this.  She is using albuterol  inhaler as needed and reports infrequent use.  Cardiology note from 05/19/2021 reviewed.  She had TEE on 03/01/2021 which showed small right to left interatrial shunting.  Pulmonary function test today show mild restriction and mild diffusion  defect.  OV 11/12/20 On simple walk in our office on 10/05/20 she had SpO2 saturations to 77% upon the first lap of walking with rapid return to 95% SpO2 with rest. She did not wish to be started on supplemental oxygen  at that time. She has similar declines in the office again today upon ambulation. Overall her breathing has been stable since last visit. She does notice benefit from the albuterol  inhaler.   ECHO on 10/22/2020 shows normal LVEF and function, normal RV function and size with mildly elevated PA systolic pressure. She has mildly enlarged left and right atria. Unable to perform bubble study due to difficulty obtaining IV access but there is reported possible shunting on this TTE study.  Pulmonary function tests today show mild restrictive and diffusion defects.   I spoke with Robie Cho PA at [facility] after the last visit about having her sleep apnea re-assessed for concern of needing supplemental oxygen  with her CPAP machine. Patient reports she is scheduled for an in lab sleep study in July.   She reports she will be flying to [facility] soon to watch the promotion of her daughter who is in the [employer].  Past Medical History:  Diagnosis Date   Diabetes (HCC)    Dyslipidemia    Hypothyroidism    Sleep apnea      Family History  Problem Relation Age of Onset   Breast cancer Sister 54     Social History   Socioeconomic History  Marital status: Divorced    Spouse name: Not on file   Number of children: Not on file   Years of education: Not on file   Highest education level: Not on file  Occupational History   Not on file  Tobacco Use   Smoking status: Former   Smokeless tobacco: Never  Substance and Sexual Activity   Alcohol use: No   Drug use: Not on file   Sexual activity: Not on file  Other Topics Concern   Not on file  Social History Narrative   Not on file   Social Drivers of Health   Financial Resource Strain: Not on file  Food  Insecurity: Not on file  Transportation Needs: Not on file  Physical Activity: Not on file  Stress: Not on file  Social Connections: Unknown (11/14/2021)   Received from [facility], [facility]   Social Network    Social Network: Not on file  Intimate Partner Violence: Unknown (10/21/2021)   Received from [facility], [facility]   HITS    Physically Hurt: Not on file    Insult or Talk Down To: Not on file    Threaten Physical Harm: Not on file    Scream or Curse: Not on file     No Known Allergies   Outpatient Medications Prior to Visit  Medication Sig Dispense Refill   albuterol  (VENTOLIN  HFA) 108 (90 Base) MCG/ACT inhaler Inhale 2 puffs into the lungs every 6 (six) hours as needed for wheezing or shortness of breath. 8 g 1   amLODipine  (NORVASC ) 10 MG tablet Take 10 mg by mouth in the morning.     atorvastatin (LIPITOR) 20 MG tablet Take 20 mg by mouth in the morning.     Cyanocobalamin (VITAMIN B-12 PO) Take 1,000 mcg by mouth in the morning.     enalapril (VASOTEC) 20 MG tablet Take 40 mg by mouth in the morning.     levothyroxine (SYNTHROID, LEVOTHROID) 75 MCG tablet Take 75 mcg by mouth daily before breakfast.     metFORMIN (GLUCOPHAGE) 500 MG tablet Take 1,000 mg by mouth daily with breakfast.     metoprolol  succinate (TOPROL -XL) 50 MG 24 hr tablet Take 1.5 tablets (75 mg total) by mouth daily. 135 tablet 3   Multiple Vitamins-Minerals (CENTRUM SILVER) CHEW Chew 1 tablet by mouth in the morning.     Omega-3 Fatty Acids (OMEGA-3 FISH OIL PO) Take 1,000 mg by mouth in the morning.     Tiotropium Bromide-Olodaterol (STIOLTO RESPIMAT ) 2.5-2.5 MCG/ACT AERS Inhale 2 puffs into the lungs daily. 12 g 4   triamterene-hydrochlorothiazide (MAXZIDE-25) 37.5-25 MG tablet Take 1 tablet by mouth in the morning.     No facility-administered medications prior to visit.    Review of Systems  Constitutional:  Negative for chills, fever, malaise/fatigue and weight loss.  HENT:   Negative for congestion, sinus pain and sore throat.   Eyes: Negative.   Respiratory:  Positive for shortness of breath. Negative for cough, hemoptysis, sputum production and wheezing.   Cardiovascular:  Negative for chest pain, palpitations, orthopnea, claudication and leg swelling.  Gastrointestinal:  Negative for abdominal pain, heartburn, nausea and vomiting.  Genitourinary: Negative.   Musculoskeletal:  Negative for joint pain and myalgias.  Skin:  Negative for rash.  Neurological:  Negative for weakness.  Endo/Heme/Allergies: Negative.   Psychiatric/Behavioral: Negative.      Objective:   Vitals:   11/15/23 1541  BP: 128/70  Pulse: 90  SpO2: 91%  Weight:  189 lb 6.4 oz (85.9 kg)  Height: 5\' 4"  (1.626 m)   Physical Exam Constitutional:      General: She is not in acute distress.    Appearance: She is obese. She is not ill-appearing.  HENT:     Head: Normocephalic and atraumatic.  Eyes:     General: No scleral icterus.    Conjunctiva/sclera: Conjunctivae normal.  Cardiovascular:     Rate and Rhythm: Normal rate and regular rhythm.     Pulses: Normal pulses.     Heart sounds: Normal heart sounds. No murmur heard. Pulmonary:     Effort: Pulmonary effort is normal.     Breath sounds: Normal breath sounds. No wheezing, rhonchi or rales.  Musculoskeletal:     Right lower leg: No edema.     Left lower leg: No edema.  Skin:    General: Skin is warm and dry.  Neurological:     Mental Status: She is alert.    CBC    Component Value Date/Time   WBC 9.1 02/21/2021 1428   RBC 6.30 (H) 02/21/2021 1428   HGB 13.8 02/21/2021 1428   HCT 46.2 02/21/2021 1428   PLT 276 02/21/2021 1428   MCV 73 (L) 02/21/2021 1428   MCH 21.9 (L) 02/21/2021 1428   MCHC 29.9 (L) 02/21/2021 1428   RDW 14.9 02/21/2021 1428   Chest imaging: CT Chest Lung Cancer Screening 09/15/20 1. Lung-RADS 2s, benign appearance or behavior. Continue annual screening with low-dose chest CT without contrast  in 12 months.  Mediastinum/Nodes: Normal appearance of the thyroid  gland. The trachea appears patent and is midline. Normal appearance of the esophagus. No enlarged lymph nodes.   Lungs/Pleura: Mild changes of centrilobular and paraseptal emphysema. Scarring is noted within both lung bases. Several tiny nodules are identified. The largest is in the superior segment of left lower lobe with an equivalent diameter of 3.9 mm. No suspicious lung nodules identified at this time.  There is a conglomeration of multiple large complicated cyst arising off the upper pole of the left kidney. This is incompletely characterized without IV contrast material  PFT:    Latest Ref Rng & Units 11/12/2020   10:27 AM  PFT Results  FVC-Pre L 1.81   FVC-Predicted Pre % 82   FVC-Post L 1.98   FVC-Predicted Post % 89   Pre FEV1/FVC % % 83   Post FEV1/FCV % % 83   FEV1-Pre L 1.50   FEV1-Predicted Pre % 88   FEV1-Post L 1.65   DLCO uncorrected ml/min/mmHg 14.03   DLCO UNC% % 73   DLCO corrected ml/min/mmHg 14.03   DLCO COR %Predicted % 73   DLVA Predicted % 107   TLC L 3.58   TLC % Predicted % 70   RV % Predicted % 71   PFTs 2023 mild restriction and mild diffusion defect     Assessment & Plan:   Pulmonary emphysema, unspecified emphysema type (HCC)  Chronic hypoxemic respiratory failure (HCC)  Obstructive sleep apnea on CPAP  Discussion: Karen Black is a 78 year old woman, former smoker with diabetes and sleep apnea on CPAP who returns to pulmonary clinic for chronic hypoxemic respiratory failure and emphysema.   Chronic Obstructive Pulmonary Disease (COPD) -continue stiolto inhaler 2 puffs daily -continue albuterol  inhaler as needed - will consider transitioning to breztri or trelegy in the future  Chronic Hypoxemic Respiratory failure - use 3L pulsed via POC with ambulation based on simple walk today - continue 3L of  oxygen  with CPAP at night - no need to use oxygen  at  rest  Follow up in 4 months  Duaine German, MD [facility] Office: [phone]   Current Outpatient Medications:    albuterol  (VENTOLIN  HFA) 108 (90 Base) MCG/ACT inhaler, Inhale 2 puffs into the lungs every 6 (six) hours as needed for wheezing or shortness of breath., Disp: 8 g, Rfl: 1   amLODipine  (NORVASC ) 10 MG tablet, Take 10 mg by mouth in the morning., Disp: , Rfl:    atorvastatin (LIPITOR) 20 MG tablet, Take 20 mg by mouth in the morning., Disp: , Rfl:    Cyanocobalamin (VITAMIN B-12 PO), Take 1,000 mcg by mouth in the morning., Disp: , Rfl:    enalapril (VASOTEC) 20 MG tablet, Take 40 mg by mouth in the morning., Disp: , Rfl:    levothyroxine (SYNTHROID, LEVOTHROID) 75 MCG tablet, Take 75 mcg by mouth daily before breakfast., Disp: , Rfl:    metFORMIN (GLUCOPHAGE) 500 MG tablet, Take 1,000 mg by mouth daily with breakfast., Disp: , Rfl:    metoprolol  succinate (TOPROL -XL) 50 MG 24 hr tablet, Take 1.5 tablets (75 mg total) by mouth daily., Disp: 135 tablet, Rfl: 3   Multiple Vitamins-Minerals (CENTRUM SILVER) CHEW, Chew 1 tablet by mouth in the morning., Disp: , Rfl:    Omega-3 Fatty Acids (OMEGA-3 FISH OIL PO), Take 1,000 mg by mouth in the morning., Disp: , Rfl:    Tiotropium Bromide-Olodaterol (STIOLTO RESPIMAT ) 2.5-2.5 MCG/ACT AERS, Inhale 2 puffs into the lungs daily., Disp: 12 g, Rfl: 4   triamterene-hydrochlorothiazide (MAXZIDE-25) 37.5-25 MG tablet, Take 1 tablet by mouth in the morning., Disp: , Rfl:

## 2023-11-19 DIAGNOSIS — N1831 Chronic kidney disease, stage 3a: Secondary | ICD-10-CM | POA: Diagnosis not present

## 2023-11-19 DIAGNOSIS — N281 Cyst of kidney, acquired: Secondary | ICD-10-CM | POA: Diagnosis not present

## 2023-11-19 DIAGNOSIS — D4102 Neoplasm of uncertain behavior of left kidney: Secondary | ICD-10-CM | POA: Diagnosis not present

## 2023-11-22 ENCOUNTER — Encounter: Payer: Self-pay | Admitting: Cardiovascular Disease

## 2023-11-22 ENCOUNTER — Ambulatory Visit: Payer: Medicare Other | Attending: Cardiovascular Disease | Admitting: Cardiovascular Disease

## 2023-11-22 VITALS — BP 146/84 | HR 75 | Ht 65.0 in | Wt 187.0 lb

## 2023-11-22 DIAGNOSIS — J439 Emphysema, unspecified: Secondary | ICD-10-CM | POA: Diagnosis not present

## 2023-11-22 DIAGNOSIS — E039 Hypothyroidism, unspecified: Secondary | ICD-10-CM

## 2023-11-22 DIAGNOSIS — I10 Essential (primary) hypertension: Secondary | ICD-10-CM

## 2023-11-22 DIAGNOSIS — Q2112 Patent foramen ovale: Secondary | ICD-10-CM | POA: Diagnosis not present

## 2023-11-22 DIAGNOSIS — G4733 Obstructive sleep apnea (adult) (pediatric): Secondary | ICD-10-CM | POA: Diagnosis not present

## 2023-11-22 DIAGNOSIS — E118 Type 2 diabetes mellitus with unspecified complications: Secondary | ICD-10-CM | POA: Diagnosis not present

## 2023-11-22 NOTE — Progress Notes (Signed)
 Cardiology Office Note    Date:  11/24/2023   ID:  Karen Black, DOB Nov 28, 1945, MRN [id number]  PCP:  Dorena Gander, MD  Cardiologist:  Magnus Schuller, MD   14 month follow-up cardiology evaluation initially referred through the courtesy of Dr. Duaine German in this patient with obstructive sleep apnea, with low oxygen  saturation, and transthoracic echo raising concern for possible shunting.  History of Present Illness:  Karen Black is a 78 y.o. female who has a history of prior tobacco use, diabetes mellitus, pulmonary emphysema on CT imaging along with mild restrictive and diffusion defects on pulmonary function testing and obstructive sleep apnea on CPAP therapy.  She is followed by [facility] primary care.  She was seen by Dr. Diania Fortes at [facility] pulmonary for low oxygen  saturation.  A walk in his office in March 2022 showed an O2 desaturation to 77% with the first lap of walking and with rapid return to 95% with rest.  She was not started on supplemental oxygen  at that time.  However in her April 2022 evaluation it was recommended that she initiate supplemental oxygen  at 2 to 3 L.  She had undergone a transthoracic echo Doppler study   on October 22, 2020 which showed an EF of 55 to 60% without wall motion abnormality.  She had normal RV function.  There was mildly elevated pulmonary artery systolic pressure, mild biatrial enlargement, as well as mild aortic valve sclerosis without stenosis.  There was concern for possible shunting across the atrial septum, not definitive. She has a history of obstructive sleep apnea for over 20 years and had a follow-up sleep study by Dr. Ula Gambler for evaluation of persisting hypoxemia in the setting of well-controlled OSA on CPAP on December 27, 2020.  The overall AHI was 3.1/h and RDI 9.7/h with a central index of 0.3/h.  During titration apneas and hypopneas were abolished at lower pressures but the most appropriate setting control where was was with BiPAP at  24/20 cm of water.  The patient was treated with supplemental oxygen  at 1 L.  It is felt that the patient's degree of hypoxemia on exertion does not seem to fit her CT chest findings or PFT findings.  As result, she is now referred for cardiology evaluation for further assessment.  When I saw her for my initial evaluation on February 21, 2021 with her history of hypertension she was on amlodipine  5 mg, extended propranolol 160 mg SR, quinapril 40 mg, and triamterene/HCTZ 37.5/25 mg daily.  She has been on Stiolto Respimat  and albuterol  for her lungs.  She has hypothyroidism on levothyroxine 75 mcg.  She has a history of hyperlipidemia and has been on low-dose atorvastatin at 10 mg.  She was on metformin 500 mg daily and is followed by Dr. Washington Hacker.  She denied any chest pain and was unaware of nocturnal arrhythmias.  She denies any awareness of atrial fibrillation.  She is now on supplemental oxygen  with her BiPAP therapy.  During that evaluation, with her lung disease I recommended she change to a more cardioselective beta-blocker and discontinue propranolol extended release and initiated metoprolol  succinate initially at 50 mg for the first 4 days within to increase to 75 mg daily.  I also scheduled her to undergo a transesophageal echo for further evaluation of possible shunting.  Karen Black underwent her transesophageal echo on February 28, 2021.  This confirmed normal EF at 65 to 70% without wall motion abnormalities.  There was mild mitral regurgitation.  There  was evidence for atrial level shunting detected by color-flow Doppler.  Agitated saline contrast bubble study also was positive with mild shunting observed within 3-6 cardiac cycles.  It was felt that this was due to a very small PFO with minimal right to left shunting and that the shunting is intermittent and very brief and most likely is not the cause to explain her hypoxia/dyspnea.  When I saw her on May 19, 2021 she felt well.  Her blood  pressure at home typically runs between 130 and 160 systolically.  She wasunaware of any cardiac arrhythmias.  She was using her BiPAP therapy with supplemental oxygen  followed at [facility].  During her evaluation, blood pressure continued to be consistently greater than 130 and I recommended amlodipine  titration from 5 mg to 7.5.  At the time she was also taking quinapril 40 mg and triamterene HCT.  She continued be on levothyroxine for hypothyroidism and was on metformin for diabetes mellitus.  She Has tolerated atorvastatin for lipid management.  She was evaluated by Sharren Decree, PA-C on December 27, 2021 and was stable from a cardiac standpoint.  She had experienced 1 isolated episode of atypical chest pain not felt to be ischemic.  I last saw her on October 06, 2018 for at which time she felt well and denied any chest pain or shortness of breath.  She was continuing to use  BiPAP with supplemental oxygen  followed by Sydney Eve at [place].  She is now seeing Daphney Eans, PA for primary care.  She continues to be on amlodipine  7.5 mg, enalapril 40 mg, metoprolol  succinate 75 mg 4 and triamterene HCT 37.5/25 mg for blood pressure control.  She continues to be followed by Dr. Duaine German of pulmonology and is on Stiolto Respimat  and takes as needed albuterol .    Since I last saw her, she continues to be followed at [facility] for her obstructive sleep apnea and is on supplemental oxygen .  She denies chest pain or shortness of breath.  She admits that her oxygen  level drops with walking.  She is unaware of any significant arrhythmias.  She continues to be on amlodipine  10 mg, enalapril 20 mg, metoprolol  succinate 75 mg, and triamterene/HCTZ 37/25 mg daily for hypertension.  She is on atorvastatin 20 mg and omega-3 fatty acid for lipid management.  She takes as needed albuterol .  She is diabetic on metformin and also takes levothyroxine 75 mcg for hypothyroidism.  She presents for evaluation.   Past Medical  History:  Diagnosis Date   Diabetes (HCC)    Dyslipidemia    Hypothyroidism    Sleep apnea     Surgical history is notable for hysterectomy  Current Medications: Outpatient Medications Prior to Visit  Medication Sig Dispense Refill   albuterol  (VENTOLIN  HFA) 108 (90 Base) MCG/ACT inhaler Inhale 2 puffs into the lungs every 6 (six) hours as needed for wheezing or shortness of breath. 8 g 1   amLODipine  (NORVASC ) 10 MG tablet Take 10 mg by mouth in the morning.     atorvastatin (LIPITOR) 20 MG tablet Take 20 mg by mouth in the morning.     Cyanocobalamin (VITAMIN B-12 PO) Take 1,000 mcg by mouth in the morning.     enalapril (VASOTEC) 20 MG tablet Take 40 mg by mouth in the morning.     levothyroxine (SYNTHROID, LEVOTHROID) 75 MCG tablet Take 75 mcg by mouth daily before breakfast.     metFORMIN (GLUCOPHAGE) 500 MG tablet Take 1,000 mg by mouth daily  with breakfast.     metoprolol  succinate (TOPROL -XL) 50 MG 24 hr tablet Take 1.5 tablets (75 mg total) by mouth daily. 135 tablet 3   Multiple Vitamins-Minerals (CENTRUM SILVER) CHEW Chew 1 tablet by mouth in the morning.     Omega-3 Fatty Acids (OMEGA-3 FISH OIL PO) Take 1,000 mg by mouth in the morning.     Tiotropium Bromide-Olodaterol (STIOLTO RESPIMAT ) 2.5-2.5 MCG/ACT AERS Inhale 2 puffs into the lungs daily. 12 g 4   triamterene-hydrochlorothiazide (MAXZIDE-25) 37.5-25 MG tablet Take 1 tablet by mouth in the morning.     No facility-administered medications prior to visit.     Allergies:   Patient has no known allergies.   Social History   Socioeconomic History   Marital status: Divorced    Spouse name: Not on file   Number of children: Not on file   Years of education: Not on file   Highest education level: Not on file  Occupational History   Not on file  Tobacco Use   Smoking status: Former   Smokeless tobacco: Never  Substance and Sexual Activity   Alcohol use: No   Drug use: Not on file   Sexual activity: Not on  file  Other Topics Concern   Not on file  Social History Narrative   Not on file   Social Drivers of Health   Financial Resource Strain: Not on file  Food Insecurity: Not on file  Transportation Needs: Not on file  Physical Activity: Not on file  Stress: Not on file  Social Connections: Unknown (11/14/2021)   Received from [facility]   Social Network    Social Network: Not on file    Socially she was born in [place] , he has lived in [place] for approximately 10 years.  She is divorced.  She has 1 daughter who who lives in the police department in [place] .  Patient is a retired Airline pilot.  She had smoked for 30 years but quit 15 years ago.  She does not routinely exercise.   Family History:  The patient's family history includes Breast cancer (age of onset: 55) in her sister.  Both parents are deceased, mother died at age 59, father had liver cancer and died at age 75.  She has 2 deceased brothers 1 age 52 who had dementia and other age 32 who had thyroid  issues.  She has 1 deceased sister who died of cancer at 47.  ROS General: Negative; No fevers, chills, or night sweats; obesity HEENT: Negative; No changes in vision or hearing, sinus congestion, difficulty swallowing Pulmonary: Pulmonary emphysema Cardiovascular: Negative; No chest pain, presyncope, syncope, palpitations GI: Negative; No nausea, vomiting, diarrhea, or abdominal pain GU: Negative; No dysuria, hematuria, or difficulty voiding Musculoskeletal: Negative; no myalgias, joint pain, or weakness Hematologic/Oncology: Negative; no easy bruising, bleeding Endocrine: Negative; no heat/cold intolerance; no diabetes Neuro: Negative; no changes in balance, headaches Skin: Negative; No rashes or skin lesions Psychiatric: Negative; No behavioral problems, depression Sleep: OSA, currently on BiPAP with supplemental O2 Other comprehensive 14 point system review is negative.   PHYSICAL  EXAM:   VS:  BP (!) 146/84 (BP Location: Right Arm, Patient Position: Sitting)   Pulse 75   Ht 5\' 5"  (1.651 m)   Wt 187 lb (84.8 kg)   SpO2 93%   BMI 31.12 kg/m     Repeat blood pressure by me was 130/78  Wt Readings from Last 3 Encounters:  11/22/23 187 lb (84.8  kg)  11/15/23 189 lb 6.4 oz (85.9 kg)  08/09/23 190 lb 6.4 oz (86.4 kg)    General: Alert, oriented, no distress.  Skin: normal turgor, no rashes, warm and dry HEENT: Normocephalic, atraumatic. Pupils equal round and reactive to light; sclera anicteric; extraocular muscles intact;  Nose without nasal septal hypertrophy Mouth/Parynx benign; Mallinpatti scale 4 Neck: No JVD, no carotid bruits; normal carotid upstroke Lungs: clear to ausculatation and percussion; no wheezing or rales Chest wall: without tenderness to palpitation Heart: PMI not displaced, RRR, s1 s2 normal, 1/6 systolic murmur, no diastolic murmur, no rubs, gallops, thrills, or heaves Abdomen: soft, nontender; no hepatosplenomehaly, BS+; abdominal aorta nontender and not dilated by palpation. Back: no CVA tenderness Pulses 2+ Musculoskeletal: full range of motion, normal strength, no joint deformities Extremities: no clubbing cyanosis or edema, Homan's sign negative  Neurologic: grossly nonfocal; Cranial nerves grossly wnl Psychologic: Normal mood and affect    Studies/Labs Reviewed:   EKG Interpretation Date/Time:  Thursday Nov 22 2023 09:12:13 EDT Ventricular Rate:  76 PR Interval:  154 QRS Duration:  82 QT Interval:  374 QTC Calculation: 420 R Axis:   15  Text Interpretation: Normal sinus rhythm Cannot rule out Anterior infarct , age undetermined No previous ECGs available Confirmed by Magnus Schuller ([id number]) on 11/22/2023 9:36:38 AM    October 06, 2022   ECG (independently read by me): NSR at 86, nonspecific ST abnormality  May 19, 2021 ECG (independently read by me): NSR at 74, QS V1-2, No ST changes, no ectopy, normal intervals  February 21, 2021 ECG (independently read by me): NSR at 70, QS V1-2; normal intervals  Recent Labs:    Latest Ref Rng & Units 09/09/2021    8:55 AM 02/21/2021    2:28 PM 01/28/2020   12:00 AM  BMP  Glucose 65 - 99 mg/dL  88    BUN 8 - 27 mg/dL  19  16      Creatinine 0.57 - 1.00 mg/dL  7.84  1.1      BUN/Creat Ratio 12 - 28  15    Sodium 134 - 144 mmol/L  143    Potassium 3.5 - 5.2 mmol/L  4.2    Chloride 96 - 106 mmol/L  101    CO2 20 - 29 mmol/L  22    Calcium 8.6 - 10.4 mg/dL 69.6  29.5       This result is from an external source.        Latest Ref Rng & Units 09/09/2021    8:55 AM 03/11/2021    8:40 AM 04/18/2018    8:19 AM  Hepatic Function  Total Protein 6.0 - 8.3 g/dL   7.7   Albumin 3.5 - 5.2 g/dL   3.9   AST 0 - 37 U/L   20   ALT 0 - 35 U/L   13   Alk Phosphatase 39 - 117 U/L 165  164  143   Total Bilirubin 0.2 - 1.2 mg/dL   0.7   Bilirubin, Direct 0.0 - 0.3 mg/dL   0.1        Latest Ref Rng & Units 02/21/2021    2:28 PM  CBC  WBC 3.4 - 10.8 x10E3/uL 9.1   Hemoglobin 11.1 - 15.9 g/dL 28.4   Hematocrit 13.2 - 46.6 % 46.2   Platelets 150 - 450 x10E3/uL 276    Lab Results  Component Value Date   MCV 73 (L) 02/21/2021   Lab Results  Component Value Date   TSH 1.13 09/09/2021   Lab Results  Component Value Date   HGBA1C 6.8 01/28/2020     BNP No results found for: "BNP"  ProBNP No results found for: "PROBNP"   Lipid Panel  No results found for: "CHOL", "TRIG", "HDL", "CHOLHDL", "VLDL", "LDLCALC", "LDLDIRECT", "LABVLDL"   RADIOLOGY: No results found.   Additional studies/ records that were reviewed today include:  I personally reviewed the records of [facility], laboratory from the normal triad, patient's BiPAP titration study by Dr. Ula Gambler.  ECHO: 10/22/2020 IMPRESSIONS   1. Left ventricular ejection fraction, by estimation, is 55 to 60%. The  left ventricle has normal function. The left ventricle has no regional  wall motion abnormalities. Left  ventricular diastolic parameters are  indeterminate.   2. Right ventricular systolic function is normal. The right ventricular  size is normal. There is mildly elevated pulmonary artery systolic  pressure.   3. Left atrial size was mildly dilated.   4. Right atrial size was mildly dilated.   5. The mitral valve is normal in structure. Trivial mitral valve  regurgitation. No evidence of mitral stenosis.   6. The aortic valve is tricuspid. There is mild calcification of the  aortic valve. Aortic valve regurgitation is not visualized. Mild aortic  valve sclerosis is present, with no evidence of aortic valve stenosis.   7. Possible shunting seen.    TEE ECHO: 03/01/2021 IMPRESSIONS   1. Left ventricular ejection fraction, by estimation, is 65 to 70%. The  left ventricle has normal function. The left ventricle has no regional  wall motion abnormalities.   2. Right ventricular systolic function is normal. The right ventricular  size is normal.   3. No left atrial/left atrial appendage thrombus was detected.   4. The mitral valve is normal in structure. Mild mitral valve  regurgitation.   5. The aortic valve is tricuspid. Aortic valve regurgitation is not  visualized. No aortic stenosis is present.   6. Evidence of atrial level shunting detected by color flow Doppler.  Agitated saline contrast bubble study was positive with shunting observed  within 3-6 cardiac cycles suggestive of interatrial shunt.   FINDINGS   Left Ventricle: Left ventricular ejection fraction, by estimation, is 65  to 70%. The left ventricle has normal function. The left ventricle has no  regional wall motion abnormalities. The left ventricular internal cavity  size was normal in size.   Right Ventricle: The right ventricular size is normal. Right vetricular  wall thickness was not well visualized. Right ventricular systolic  function is normal.   Left Atrium: Left atrial size was normal in size. No left  atrial/left  atrial appendage thrombus was detected.   Right Atrium: Right atrial size was normal in size.   Pericardium: Trivial pericardial effusion is present.   Mitral Valve: The mitral valve is normal in structure. Mild mitral valve  regurgitation.   Tricuspid Valve: The tricuspid valve is normal in structure. Tricuspid  valve regurgitation is not demonstrated.   Aortic Valve: The aortic valve is tricuspid. Aortic valve regurgitation is  not visualized. No aortic stenosis is present.   Pulmonic Valve: The pulmonic valve was grossly normal. Pulmonic valve  regurgitation is not visualized.   Aorta: The aortic root and ascending aorta are structurally normal, with  no evidence of dilitation.   IAS/Shunts: Evidence of atrial level shunting detected by color flow  Doppler. Agitated saline contrast was given intravenously to evaluate for  intracardiac shunting. Agitated  saline contrast bubble study was positive  with shunting observed within 3-6  cardiac cycles suggestive of interatrial shunt. There is a very small PFO  with minimal right to left shunting. The shunting is intermittant and very  brief and likely does not explain her hypoxemia / dyspnea.   ASSESSMENT:    1. Essential hypertension   2. Small PFO (patent foramen ovale)   3. Pulmonary emphysema, unspecified emphysema type (HCC)   4. OSA (obstructive sleep apnea)   5. Type 2 diabetes mellitus with complication, without long-term current use of insulin (HCC)   6. Hypothyroidism, unspecified type     PLAN:  Karen Black is a very pleasant 78 year old female who is originally from [place], [place] .  She has a previous 35-year history of tobacco use and fortunately quit over 15 years ago.  She has evidence of pulmonary emphysema on CT imaging and has been demonstrated to have mild restrictive and diffusion defect on pulmonary function studies.  She has a longstanding history of obstructive sleep apnea for over  20 years and has had issues with recent significant oxygen  desaturation on exertion.  She has been demonstrated to have significant nocturnal hypoxemia and is now on BiPAP with supplemental oxygenation.  Her degree of hypoxemia on exertion has been felt to be out of proportion to her CT chest findings or PFT findings.  A transthoracic echo Doppler evaluation confirmed normal systolic function without wall motion abnormalities.  Diastolic parameters were indeterminate.  Her right ventricle size and function are normal and she had evidence for mild pulmonary artery systolic pressure.  On her transthoracic echo, concern was raised about possible shunting but this could not be verified.  Since her initial evaluation she underwent a transesophageal echo on March 01, 2021 which verified evidence for a very small PFO interatrial shunt that was intermittent and very brief and not likely to be the cause of her hypoxemia and dyspnea.  She has continued to be followed by Sydney Eve at [facility] with BiPAP and supplemental oxygen .  She sees Dr. Diania Fortes for her lung disease and is on Stiolto Respimat  in addition to as needed albuterol .  Presently, her blood pressure is stable on amlodipine  10 mg, enalapril 20 mg, metoprolol  succinate 75 mg, and triamterene HCTZ 37/25 mg daily.  She is diabetic on metformin and takes levothyroxine 75 mcg for hypothyroidism.  ECG today shows sinus rhythm at 76.  She still admits to occasional drop in oxygen  with walking which stabilizes with rest.  I discussed with her my plans for retirement.  I have recommended she undergo a follow-up echo Doppler study in January 2026 and I will transition her to the care of Dr. Carson Clara to be seen in January/February 2016.   Medication Adjustments/Labs and Tests Ordered: Current medicines are reviewed at length with the patient today.  Concerns regarding medicines are outlined above.  Medication changes, Labs and Tests ordered today are  listed in the Patient Instructions below. Patient Instructions  Medication Instructions:  Your physician recommends that you continue on your current medications as directed. Please refer to the Current Medication list given to you today.  *If you need a refill on your cardiac medications before your next appointment, please call your pharmacy*  Lab Work: NONE If you have labs (blood work) drawn today and your tests are completely normal, you will receive your results only by: MyChart Message (if you have MyChart) OR A paper copy in the mail If you have any lab  test that is abnormal or we need to change your treatment, we will call you to review the results.  Testing/Procedures: Your physician has requested that you have an echocardiogram TO BE DONE IN 07/2024. Echocardiography is a painless test that uses sound waves to create images of your heart. It provides your doctor with information about the size and shape of your heart and how well your heart's chambers and valves are working. This procedure takes approximately one hour. There are no restrictions for this procedure. Please Black NOT wear cologne, perfume, aftershave, or lotions (deodorant is allowed). Please arrive 15 minutes prior to your appointment time.  Please note: We ask at that you not bring children with you during ultrasound (echo/ vascular) testing. Due to room size and safety concerns, children are not allowed in the ultrasound rooms during exams. Our front office staff cannot provide observation of children in our lobby area while testing is being conducted. An adult accompanying a patient to their appointment will only be allowed in the ultrasound room at the discretion of the ultrasound technician under special circumstances. We apologize for any inconvenience.   Follow-Up: At [facility], you and your health needs are our priority.  As part of our continuing mission to provide you with exceptional heart care, our  providers are all part of one team.  This team includes your primary Cardiologist (physician) and Advanced Practice Providers or APPs (Physician Assistants and Nurse Practitioners) who all work together to provide you with the care you need, when you need it.  Your next appointment:   9 month(s)  Provider:   DR. Alda Amas  We recommend signing up for the patient portal called "MyChart".  Sign up information is provided on this After Visit Summary.  MyChart is used to connect with patients for Virtual Visits (Telemedicine).  Patients are able to view lab/test results, encounter notes, upcoming appointments, etc.  Non-urgent messages can be sent to your provider as well.   To learn more about what you can Black with MyChart, go to [email or web address].   Other Instructions        Signed, Magnus Schuller, MD  11/24/2023 3:31 PM    [facility] [address], [place], [place]  [address] Phone: [phone]

## 2023-11-22 NOTE — Patient Instructions (Signed)
 Medication Instructions:  Your physician recommends that you continue on your current medications as directed. Please refer to the Current Medication list given to you today.  *If you need a refill on your cardiac medications before your next appointment, please call your pharmacy*  Lab Work: NONE If you have labs (blood work) drawn today and your tests are completely normal, you will receive your results only by: MyChart Message (if you have MyChart) OR A paper copy in the mail If you have any lab test that is abnormal or we need to change your treatment, we will call you to review the results.  Testing/Procedures: Your physician has requested that you have an echocardiogram TO BE DONE IN 07/2024. Echocardiography is a painless test that uses sound waves to create images of your heart. It provides your doctor with information about the size and shape of your heart and how well your heart's chambers and valves are working. This procedure takes approximately one hour. There are no restrictions for this procedure. Please do NOT wear cologne, perfume, aftershave, or lotions (deodorant is allowed). Please arrive 15 minutes prior to your appointment time.  Please note: We ask at that you not bring children with you during ultrasound (echo/ vascular) testing. Due to room size and safety concerns, children are not allowed in the ultrasound rooms during exams. Our front office staff cannot provide observation of children in our lobby area while testing is being conducted. An adult accompanying a patient to their appointment will only be allowed in the ultrasound room at the discretion of the ultrasound technician under special circumstances. We apologize for any inconvenience.   Follow-Up: At [facility], you and your health needs are our priority.  As part of our continuing mission to provide you with exceptional heart care, our providers are all part of one team.  This team includes your  primary Cardiologist (physician) and Advanced Practice Providers or APPs (Physician Assistants and Nurse Practitioners) who all work together to provide you with the care you need, when you need it.  Your next appointment:   9 month(s)  Provider:   DR. Alda Amas  We recommend signing up for the patient portal called "MyChart".  Sign up information is provided on this After Visit Summary.  MyChart is used to connect with patients for Virtual Visits (Telemedicine).  Patients are able to view lab/test results, encounter notes, upcoming appointments, etc.  Non-urgent messages can be sent to your provider as well.   To learn more about what you can do with MyChart, go to [email or web address].   Other Instructions

## 2023-11-24 ENCOUNTER — Encounter: Payer: Self-pay | Admitting: Cardiovascular Disease

## 2023-11-30 DIAGNOSIS — N281 Cyst of kidney, acquired: Secondary | ICD-10-CM | POA: Diagnosis not present

## 2023-11-30 DIAGNOSIS — D49519 Neoplasm of unspecified behavior of unspecified kidney: Secondary | ICD-10-CM | POA: Diagnosis not present

## 2023-12-06 DIAGNOSIS — D4102 Neoplasm of uncertain behavior of left kidney: Secondary | ICD-10-CM | POA: Diagnosis not present

## 2023-12-06 DIAGNOSIS — R918 Other nonspecific abnormal finding of lung field: Secondary | ICD-10-CM | POA: Diagnosis not present

## 2023-12-06 DIAGNOSIS — N281 Cyst of kidney, acquired: Secondary | ICD-10-CM | POA: Diagnosis not present

## 2024-01-04 DIAGNOSIS — N2881 Hypertrophy of kidney: Secondary | ICD-10-CM | POA: Diagnosis not present

## 2024-01-04 DIAGNOSIS — N281 Cyst of kidney, acquired: Secondary | ICD-10-CM | POA: Diagnosis not present

## 2024-01-04 DIAGNOSIS — N289 Disorder of kidney and ureter, unspecified: Secondary | ICD-10-CM | POA: Diagnosis not present

## 2024-01-07 DIAGNOSIS — N281 Cyst of kidney, acquired: Secondary | ICD-10-CM | POA: Diagnosis not present

## 2024-01-07 DIAGNOSIS — N2889 Other specified disorders of kidney and ureter: Secondary | ICD-10-CM | POA: Diagnosis not present

## 2024-02-13 DIAGNOSIS — I83892 Varicose veins of left lower extremities with other complications: Secondary | ICD-10-CM | POA: Diagnosis not present

## 2024-02-14 DIAGNOSIS — E890 Postprocedural hypothyroidism: Secondary | ICD-10-CM | POA: Diagnosis not present

## 2024-02-14 DIAGNOSIS — J439 Emphysema, unspecified: Secondary | ICD-10-CM | POA: Diagnosis not present

## 2024-02-14 DIAGNOSIS — I10 Essential (primary) hypertension: Secondary | ICD-10-CM | POA: Diagnosis not present

## 2024-02-16 LAB — HEMOGLOBIN A1C: Hemoglobin A1C: 6

## 2024-02-16 LAB — LIPID PANEL
Cholesterol: 136
HDL: 60
LDL: 44
Triglycerides: 158 (ref 40–160)

## 2024-02-16 LAB — AMB RESULTS CONSOLE CBG: Glucose: 114

## 2024-02-16 NOTE — Progress Notes (Unsigned)
 Patient attended screening event on 02/16/2024 where her blood glucose was 114, A1C was 6.0, and BP was 162/82 & 136/83. Patient noted Dr. Rolinda as her PCP, has Medicare, and did not notate if she is a smoker. No SDOH needs indicated at the time of the event. Patient was instructed to speak with her PCP at her upcoming appt about her blood glucose, BP, and cholesterol results. Patient was also advised on healthy eating habits.

## 2024-02-18 DIAGNOSIS — Z961 Presence of intraocular lens: Secondary | ICD-10-CM | POA: Diagnosis not present

## 2024-02-18 DIAGNOSIS — H1131 Conjunctival hemorrhage, right eye: Secondary | ICD-10-CM | POA: Diagnosis not present

## 2024-02-18 DIAGNOSIS — H11133 Conjunctival pigmentations, bilateral: Secondary | ICD-10-CM | POA: Diagnosis not present

## 2024-02-18 DIAGNOSIS — E119 Type 2 diabetes mellitus without complications: Secondary | ICD-10-CM | POA: Diagnosis not present

## 2024-02-20 ENCOUNTER — Other Ambulatory Visit: Payer: Self-pay

## 2024-02-20 ENCOUNTER — Encounter (HOSPITAL_COMMUNITY): Payer: Self-pay

## 2024-02-20 ENCOUNTER — Emergency Department (HOSPITAL_COMMUNITY): Admission: EM | Admit: 2024-02-20 | Discharge: 2024-02-20 | Disposition: A | Discharge status: Home / Self Care

## 2024-02-20 ENCOUNTER — Emergency Department (HOSPITAL_COMMUNITY)

## 2024-02-20 DIAGNOSIS — J441 Chronic obstructive pulmonary disease with (acute) exacerbation: Secondary | ICD-10-CM | POA: Insufficient documentation

## 2024-02-20 DIAGNOSIS — E039 Hypothyroidism, unspecified: Secondary | ICD-10-CM | POA: Insufficient documentation

## 2024-02-20 DIAGNOSIS — Z79899 Other long term (current) drug therapy: Secondary | ICD-10-CM | POA: Insufficient documentation

## 2024-02-20 DIAGNOSIS — E119 Type 2 diabetes mellitus without complications: Secondary | ICD-10-CM | POA: Insufficient documentation

## 2024-02-20 DIAGNOSIS — Z7984 Long term (current) use of oral hypoglycemic drugs: Secondary | ICD-10-CM | POA: Diagnosis not present

## 2024-02-20 DIAGNOSIS — R42 Dizziness and giddiness: Secondary | ICD-10-CM | POA: Diagnosis not present

## 2024-02-20 DIAGNOSIS — R059 Cough, unspecified: Secondary | ICD-10-CM | POA: Diagnosis present

## 2024-02-20 DIAGNOSIS — R0600 Dyspnea, unspecified: Secondary | ICD-10-CM | POA: Diagnosis not present

## 2024-02-20 DIAGNOSIS — R0602 Shortness of breath: Secondary | ICD-10-CM | POA: Diagnosis not present

## 2024-02-20 DIAGNOSIS — J9811 Atelectasis: Secondary | ICD-10-CM | POA: Diagnosis not present

## 2024-02-20 DIAGNOSIS — R0989 Other specified symptoms and signs involving the circulatory and respiratory systems: Secondary | ICD-10-CM | POA: Diagnosis not present

## 2024-02-20 LAB — BASIC METABOLIC PANEL WITH GFR
Anion gap: 11 (ref 5–15)
BUN: 15 mg/dL (ref 8–23)
CO2: 24 mmol/L (ref 22–32)
Calcium: 10.3 mg/dL (ref 8.9–10.3)
Chloride: 103 mmol/L (ref 98–111)
Creatinine, Ser: 1.13 mg/dL — ABNORMAL HIGH (ref 0.44–1.00)
GFR, Estimated: 50 mL/min — ABNORMAL LOW (ref >60)
Glucose, Bld: 128 mg/dL — ABNORMAL HIGH (ref 70–99)
Potassium: 3.8 mmol/L (ref 3.5–5.1)
Sodium: 138 mmol/L (ref 135–145)

## 2024-02-20 LAB — RESP PANEL BY RT-PCR (RSV, FLU A&B, COVID)  RVPGX2
Influenza A by PCR: NEGATIVE
Influenza B by PCR: NEGATIVE
Resp Syncytial Virus by PCR: NEGATIVE
SARS Coronavirus 2 by RT PCR: NEGATIVE

## 2024-02-20 LAB — CBC WITH DIFFERENTIAL/PLATELET
Abs Immature Granulocytes: 0.02 K/uL (ref 0.00–0.07)
Basophils Absolute: 0 K/uL (ref 0.0–0.1)
Basophils Relative: 0 %
Eosinophils Absolute: 0.1 K/uL (ref 0.0–0.5)
Eosinophils Relative: 1 %
HCT: 45 % (ref 36.0–46.0)
Hemoglobin: 12.9 g/dL (ref 12.0–15.0)
Immature Granulocytes: 0 %
Lymphocytes Relative: 33 %
Lymphs Abs: 2.8 K/uL (ref 0.7–4.0)
MCH: 21.9 pg — ABNORMAL LOW (ref 26.0–34.0)
MCHC: 28.7 g/dL — ABNORMAL LOW (ref 30.0–36.0)
MCV: 76.4 fL — ABNORMAL LOW (ref 80.0–100.0)
Monocytes Absolute: 0.4 K/uL (ref 0.1–1.0)
Monocytes Relative: 5 %
Neutro Abs: 5.1 K/uL (ref 1.7–7.7)
Neutrophils Relative %: 61 %
Platelets: 220 K/uL (ref 150–400)
RBC: 5.89 MIL/uL — ABNORMAL HIGH (ref 3.87–5.11)
RDW: 15.6 % — ABNORMAL HIGH (ref 11.5–15.5)
WBC: 8.4 K/uL (ref 4.0–10.5)
nRBC: 0 % (ref 0.0–0.2)

## 2024-02-20 LAB — MAGNESIUM: Magnesium: 1.9 mg/dL (ref 1.7–2.4)

## 2024-02-20 LAB — TROPONIN I (HIGH SENSITIVITY): Troponin I (High Sensitivity): 5 ng/L (ref <18)

## 2024-02-20 MED ORDER — DOXYCYCLINE HYCLATE 100 MG PO TABS
100.0000 mg | ORAL_TABLET | Freq: Once | ORAL | Status: AC
  Administered 2024-02-20: 100 mg via ORAL
  Filled 2024-02-20: qty 1

## 2024-02-20 MED ORDER — PREDNISONE 20 MG PO TABS
40.0000 mg | ORAL_TABLET | Freq: Once | ORAL | Status: AC
  Administered 2024-02-20: 40 mg via ORAL
  Filled 2024-02-20: qty 2

## 2024-02-20 MED ORDER — IPRATROPIUM-ALBUTEROL 0.5-2.5 (3) MG/3ML IN SOLN
3.0000 mL | Freq: Once | RESPIRATORY_TRACT | Status: AC
  Administered 2024-02-20: 3 mL via RESPIRATORY_TRACT
  Filled 2024-02-20: qty 3

## 2024-02-20 MED ORDER — PREDNISONE 20 MG PO TABS: 40.0000 mg | ORAL_TABLET | Freq: Every day | ORAL | 0 refills | Status: AC

## 2024-02-20 MED ORDER — DOXYCYCLINE HYCLATE 100 MG PO CAPS: 100.0000 mg | ORAL_CAPSULE | Freq: Two times a day (BID) | ORAL | 0 refills | Status: DC

## 2024-02-20 NOTE — Discharge Instructions (Addendum)
 You likely have a COPD exacerbation.  Chest x-ray does not show any pneumonia or other concerns.  Blood work is all reassuring. Follow-up with your primary care doctor. Antibiotic and prednisone  sent into the pharmacy for you. Return for any emergent symptoms.

## 2024-02-20 NOTE — ED Notes (Signed)
 Phlebotomy contacted for blood draw

## 2024-02-20 NOTE — ED Provider Notes (Signed)
 [place] EMERGENCY DEPARTMENT AT [facility] Provider Note   CSN: [id number] Arrival date & time: 02/20/24  1145     Patient presents with: No chief complaint on file.   Karen Black is a 78 y.o. female.   78 year old female presents today for concern of shortness of breath.  She has history of COPD.  Denies any chest pain.  Does not wear supplemental O2 at baseline.  States she had some dizziness this morning and felt lightheaded after going to the kitchen.  She sat down and then became short of breath.  She took her inhalers but it did not make her feel any better.  Has had increasing cough over the past week with productive yellow sputum.  Denies any wheezing.  The history is provided by the patient. No language interpreter was used.       Prior to Admission medications   Medication Sig Start Date End Date Taking? Authorizing Provider  albuterol  (VENTOLIN  HFA) 108 (90 Base) MCG/ACT inhaler Inhale 2 puffs into the lungs every 6 (six) hours as needed for wheezing or shortness of breath. 08/09/23   Kara Dorn NOVAK, MD  amLODipine  (NORVASC ) 10 MG tablet Take 10 mg by mouth in the morning.    [provider]  atorvastatin (LIPITOR) 20 MG tablet Take 20 mg by mouth in the morning. 12/05/21   [provider]  Cyanocobalamin (VITAMIN B-12 PO) Take 1,000 mcg by mouth in the morning.    [provider]  enalapril (VASOTEC) 20 MG tablet Take 40 mg by mouth in the morning.    [provider]  levothyroxine (SYNTHROID, LEVOTHROID) 75 MCG tablet Take 75 mcg by mouth daily before breakfast. 10/11/16   [provider]  metFORMIN (GLUCOPHAGE) 500 MG tablet Take 1,000 mg by mouth daily with breakfast. 08/25/20   [provider]  metoprolol  succinate (TOPROL -XL) 50 MG 24 hr tablet Take 1.5 tablets (75 mg total) by mouth daily. 05/19/21 01/02/25  Burnard Debby LABOR, MD  Multiple Vitamins-Minerals (CENTRUM SILVER) CHEW Chew 1 tablet by mouth in  the morning.    [provider]  Omega-3 Fatty Acids (OMEGA-3 FISH OIL PO) Take 1,000 mg by mouth in the morning.    [provider]  Tiotropium Bromide-Olodaterol (STIOLTO RESPIMAT ) 2.5-2.5 MCG/ACT AERS Inhale 2 puffs into the lungs daily. 11/15/23   Kara Dorn NOVAK, MD  triamterene-hydrochlorothiazide (MAXZIDE-25) 37.5-25 MG tablet Take 1 tablet by mouth in the morning.    [provider]    Allergies: Patient has no known allergies.    Review of Systems  Constitutional:  Negative for chills and fever.  Respiratory:  Positive for cough and shortness of breath. Negative for wheezing.   Cardiovascular:  Negative for chest pain.  All other systems reviewed and are negative.   Updated Vital Signs BP 134/67 (BP Location: Left Arm)   Pulse 65   Temp 97.8 F (36.6 C) (Oral)   Resp 11   SpO2 99%   Physical Exam Vitals and nursing note reviewed.  Constitutional:      General: She is not in acute distress.    Appearance: Normal appearance. She is not ill-appearing.  HENT:     Head: Normocephalic and atraumatic.     Nose: Nose normal.  Eyes:     Conjunctiva/sclera: Conjunctivae normal.  Cardiovascular:     Rate and Rhythm: Normal rate and regular rhythm.  Pulmonary:     Effort: Pulmonary effort is normal. No respiratory distress.  Breath sounds: Normal breath sounds. No wheezing.  Musculoskeletal:        General: No deformity. Normal range of motion.     Cervical back: Normal range of motion.  Skin:    Findings: No rash.  Neurological:     Mental Status: She is alert.     (all labs ordered are listed, but only abnormal results are displayed) Labs Reviewed  RESP PANEL BY RT-PCR (RSV, FLU A&B, COVID)  RVPGX2  CBC WITH DIFFERENTIAL/PLATELET  BASIC METABOLIC PANEL WITH GFR  MAGNESIUM  TROPONIN I (HIGH SENSITIVITY)    EKG: EKG Interpretation Date/Time:  Wednesday February 20 2024 11:53:40 EDT Ventricular Rate:  65 PR Interval:  161 QRS  Duration:  85 QT Interval:  383 QTC Calculation: 399 R Axis:   48  Text Interpretation: Sinus rhythm Abnormal R-wave progression, early transition Confirmed by Ula Barter [phone]) on 02/20/2024 12:08:20 PM  Radiology: No results found.   Procedures   Medications Ordered in the ED  ipratropium-albuterol  (DUONEB) 0.5-2.5 (3) MG/3ML nebulizer solution 3 mL (3 mLs Nebulization Given 02/20/24 1235)                                    Medical Decision Making Amount and/or Complexity of Data Reviewed Labs: ordered. Radiology: ordered.  Risk Prescription drug management.   Medical Decision Making / ED Course   This patient presents to the ED for concern of shortness of breath, this involves an extensive number of treatment options, and is a complaint that carries with it a high risk of complications and morbidity.  The differential diagnosis includes COPD exacerbation, ACS, PE, pneumonia, viral URI  MDM: 78 year old female presents today for concern of shortness of breath.  She has history of COPD exacerbation. Endorses productive cough, and shortness of breath.  No wheezing that she has noticed. CBC without leukocytosis or anemia.  BMP without acute concern.  Troponin negative.  Given no chest pain do not feel she needs a repeat.  EKG without acute ischemic change.  Chest x-ray without acute cardiopulmonary process. Will treat for COPD exacerbation given she does have increasing productive sputum and shortness of breath.  Doxy and prednisone  prescribed. She is in agreement with this Discussed follow-up with PCP. Discharged in stable condition.   Lab Tests: -I ordered, reviewed, and interpreted labs.   The pertinent results include:   Labs Reviewed  CBC WITH DIFFERENTIAL/PLATELET - Abnormal; Notable for the following components:      Result Value   RBC 5.89 (*)    MCV 76.4 (*)    MCH 21.9 (*)    MCHC 28.7 (*)    RDW 15.6 (*)    All other components within normal limits   BASIC METABOLIC PANEL WITH GFR - Abnormal; Notable for the following components:   Glucose, Bld 128 (*)    Creatinine, Ser 1.13 (*)    GFR, Estimated 50 (*)    All other components within normal limits  RESP PANEL BY RT-PCR (RSV, FLU A&B, COVID)  RVPGX2  MAGNESIUM  TROPONIN I (HIGH SENSITIVITY)      EKG  EKG Interpretation Date/Time:  Wednesday February 20 2024 11:53:40 EDT Ventricular Rate:  65 PR Interval:  161 QRS Duration:  85 QT Interval:  383 QTC Calculation: 399 R Axis:   48  Text Interpretation: Sinus rhythm Abnormal R-wave progression, early transition Confirmed by Ula Barter [phone]) on 02/20/2024 12:08:20 PM  Imaging Studies ordered: I ordered imaging studies including chest x-ray I independently visualized and interpreted imaging. I agree with the radiologist interpretation   Medicines ordered and prescription drug management: Meds ordered this encounter  Medications   ipratropium-albuterol  (DUONEB) 0.5-2.5 (3) MG/3ML nebulizer solution 3 mL   doxycycline  (VIBRAMYCIN ) 100 MG capsule    Sig: Take 1 capsule (100 mg total) by mouth 2 (two) times daily.    Dispense:  20 capsule    Refill:  0    Supervising Provider:   MILLER, BRIAN [[id number]]   predniSONE  (DELTASONE ) 20 MG tablet    Sig: Take 2 tablets (40 mg total) by mouth daily with breakfast for 4 days.    Dispense:  8 tablet    Refill:  0    Supervising Provider:   MILLER, BRIAN [[id number]]   predniSONE  (DELTASONE ) tablet 40 mg   doxycycline  (VIBRA -TABS) tablet 100 mg    -I have reviewed the patients home medicines and have made adjustments as needed   Reevaluation: After the interventions noted above, I reevaluated the patient and found that they have :improved  Co morbidities that complicate the patient evaluation  Past Medical History:  Diagnosis Date   Diabetes (HCC)    Dyslipidemia    Hypothyroidism    Sleep apnea       Dispostion: Discharged in stable condition.  Return precaution  discussed.  Patient voices understanding and is in agreement with.   Final diagnoses:  COPD exacerbation [facility])    ED Discharge Orders          Ordered    doxycycline  (VIBRAMYCIN ) 100 MG capsule  2 times daily        02/20/24 1444    predniSONE  (DELTASONE ) 20 MG tablet  Daily with breakfast        02/20/24 1444               Hildegard Loge, PA-C 02/20/24 1450    Ula Prentice SAUNDERS, MD 02/21/24 1535

## 2024-02-20 NOTE — ED Triage Notes (Signed)
 Pt bib ems from home; woke this am with dizziness, began walking around house,  became sob; resolves at rest; sats 91-94% RA, hx copd; uses CPAP at night; lung sounds clear, 12 lead unremarkable with ems; hx vertigo; 130/66, hr 75, RR 16, cbg 131

## 2024-03-07 ENCOUNTER — Ambulatory Visit: Admitting: Pulmonary Disease

## 2024-03-07 ENCOUNTER — Encounter: Payer: Self-pay | Admitting: Pulmonary Disease

## 2024-03-07 VITALS — BP 138/77 | HR 74 | Temp 98.5°F | Ht 64.0 in | Wt 192.2 lb

## 2024-03-07 DIAGNOSIS — J439 Emphysema, unspecified: Secondary | ICD-10-CM

## 2024-03-07 DIAGNOSIS — R0602 Shortness of breath: Secondary | ICD-10-CM

## 2024-03-07 MED ORDER — ALBUTEROL SULFATE HFA 108 (90 BASE) MCG/ACT IN AERS: 2.0000 | INHALATION_SPRAY | Freq: Four times a day (QID) | RESPIRATORY_TRACT | 11 refills | Status: AC | PRN

## 2024-03-07 MED ORDER — STIOLTO RESPIMAT 2.5-2.5 MCG/ACT IN AERS: 2.0000 | INHALATION_SPRAY | Freq: Every day | RESPIRATORY_TRACT | 11 refills | Status: DC

## 2024-03-07 NOTE — Patient Instructions (Signed)
 Continue CPAP nightly, your compliance report looks great  Continue stiolto 2 puffs daily  Continue as needed albuterol   Use portable oxygen  concentrator 3L pulsed when walking or active. You can turn off concentrator/oxygen  when at rest - check with your oxygen  company whether you qualify for a lighter weight portable oxygen  concentrator or when you qualify for a new machine  Follow up in 6 months

## 2024-03-07 NOTE — Progress Notes (Signed)
 Synopsis: Referred in March 2022 by Lauraine Lites, NP for low oxygen  saturations  Subjective:   PATIENT ID: Karen Black GENDER: female DOB: [birth year], MRN: [id number]  HPI  Chief Complaint  Patient presents with   Follow-up    Pulm emphysema and chronic hypoxemic resp failure f/u Pt has no concerns to address.    Karen Black is a 78 year old woman, former smoker with diabetes and sleep apnea on CPAP who returns to pulmonary clinic for chronic respiratory failure.   She has been doing well since last visit. Her CPAP compliance is great with 100% use and all days over 4 hours of use. She is using stiolto 2 puffs daily. She has not been using her POC as discussed during last visit, reviewed the importance of this again today.  She was treated for COPD exacerbation earlier this month in ER. Reports breathing has been fine since.  OV 11/15/23 Patient seen acutely due to low oxygen  levels at her primary care office. She has not been using her supplemental oxygen  with exertion.   OV 09/13/22 They report no major issues and continue to use Stiolto without any adverse effects. However, they note a decrease in exercise tolerance, specifically experiencing dyspnea on exertion during prolonged walking. This has led to the use of a scooter for long excursions, such as a visit to the zoo, but it is not required for daily activities or short trips like grocery shopping. The [facility].  The patient has been compliant with their CPAP therapy at night based on report review.   OV 09/13/22 She has done well since last visit. She is using Stiolto 1 puff twice daily. She has not required much albuterol  as needed. She is using oxygen  with ambulation. No other complaints at this time.  OV 09/13/21 She has been doing well since last visit.  She does have portable oxygen  concentrator but reports it is very heavy and she does not use it often.  When she  exerts herself she takes frequent breaks due to shortness of breath.  She is using CPAP with 3 L supplemental oxygen  at night.  She was provided Stiolto inhaler at last visit but did not try this.  She is using albuterol  inhaler as needed and reports infrequent use.  Cardiology note from 05/19/2021 reviewed.  She had TEE on 03/01/2021 which showed small right to left interatrial shunting.  Pulmonary function test today show mild restriction and mild diffusion defect.  OV 11/12/20 On simple walk in our office on 10/05/20 she had SpO2 saturations to 77% upon the first lap of walking with rapid return to 95% SpO2 with rest. She did not wish to be started on supplemental oxygen  at that time. She has similar declines in the office again today upon ambulation. Overall her breathing has been stable since last visit. She does notice benefit from the albuterol  inhaler.   ECHO on 10/22/2020 shows normal LVEF and function, normal RV function and size with mildly elevated PA systolic pressure. She has mildly enlarged left and right atria. Unable to perform bubble study due to difficulty obtaining IV access but there is reported possible shunting on this TTE study.  Pulmonary function tests today show mild restrictive and diffusion defects.   I spoke with Delon George PA at [facility] after the last visit about having her sleep apnea re-assessed for concern of needing supplemental oxygen  with her CPAP machine. Patient reports she  is scheduled for an in lab sleep study in July.   She reports she will be flying to [facility] soon to watch the promotion of her daughter who is in the [employer].  Past Medical History:  Diagnosis Date   Diabetes (HCC)    Dyslipidemia    Hypothyroidism    Sleep apnea      Family History  Problem Relation Age of Onset   Breast cancer Sister 1     Social History   Socioeconomic History   Marital status: Divorced    Spouse name: Not on file   Number of  children: Not on file   Years of education: Not on file   Highest education level: Not on file  Occupational History   Not on file  Tobacco Use   Smoking status: Former   Smokeless tobacco: Never  Substance and Sexual Activity   Alcohol use: No   Drug use: Not on file   Sexual activity: Not on file  Other Topics Concern   Not on file  Social History Narrative   Not on file   Social Drivers of Health   Financial Resource Strain: Not on file  Food Insecurity: No Food Insecurity (02/16/2024)   Hunger Vital Sign    Worried About Running Out of Food in the Last Year: Never true    Ran Out of Food in the Last Year: Never true  Transportation Needs: No Transportation Needs (02/16/2024)   PRAPARE - Administrator, Civil Service (Medical): No    Lack of Transportation (Non-Medical): No  Physical Activity: Not on file  Stress: Not on file  Social Connections: Unknown (11/14/2021)   Received from [facility]   Social Network    Social Network: Not on file  Intimate Partner Violence: Not At Risk (02/16/2024)   Humiliation, Afraid, Rape, and Kick questionnaire    Fear of Current or Ex-Partner: No    Emotionally Abused: No    Physically Abused: No    Sexually Abused: No     No Known Allergies   Outpatient Medications Prior to Visit  Medication Sig Dispense Refill   albuterol  (VENTOLIN  HFA) 108 (90 Base) MCG/ACT inhaler Inhale 2 puffs into the lungs every 6 (six) hours as needed for wheezing or shortness of breath. 8 g 1   amLODipine  (NORVASC ) 10 MG tablet Take 10 mg by mouth in the morning.     atorvastatin (LIPITOR) 20 MG tablet Take 20 mg by mouth in the morning.     Cyanocobalamin (VITAMIN B-12 PO) Take 1,000 mcg by mouth in the morning.     enalapril (VASOTEC) 20 MG tablet Take 40 mg by mouth in the morning.     levothyroxine (SYNTHROID, LEVOTHROID) 75 MCG tablet Take 75 mcg by mouth daily before breakfast.     metFORMIN (GLUCOPHAGE) 500 MG tablet Take 1,000 mg by mouth  daily with breakfast.     metoprolol  succinate (TOPROL -XL) 50 MG 24 hr tablet Take 1.5 tablets (75 mg total) by mouth daily. 135 tablet 3   Multiple Vitamins-Minerals (CENTRUM SILVER) CHEW Chew 1 tablet by mouth in the morning.     Omega-3 Fatty Acids (OMEGA-3 FISH OIL PO) Take 1,000 mg by mouth in the morning.     Tiotropium Bromide-Olodaterol (STIOLTO RESPIMAT ) 2.5-2.5 MCG/ACT AERS Inhale 2 puffs into the lungs daily. 12 g 4   triamterene-hydrochlorothiazide (MAXZIDE-25) 37.5-25 MG tablet Take 1 tablet by mouth in the morning.     doxycycline  (VIBRAMYCIN ) 100  MG capsule Take 1 capsule (100 mg total) by mouth 2 (two) times daily. (Patient not taking: Reported on 03/07/2024) 20 capsule 0   No facility-administered medications prior to visit.    Review of Systems  Constitutional:  Negative for chills, fever, malaise/fatigue and weight loss.  HENT:  Negative for congestion, sinus pain and sore throat.   Eyes: Negative.   Respiratory:  Positive for shortness of breath. Negative for cough, hemoptysis, sputum production and wheezing.   Cardiovascular:  Negative for chest pain, palpitations, orthopnea, claudication and leg swelling.  Gastrointestinal:  Negative for abdominal pain, heartburn, nausea and vomiting.  Genitourinary: Negative.   Musculoskeletal:  Negative for joint pain and myalgias.  Skin:  Negative for rash.  Neurological:  Negative for weakness.  Endo/Heme/Allergies: Negative.   Psychiatric/Behavioral: Negative.      Objective:   Vitals:   03/07/24 0930  BP: 138/77  Pulse: 74  Temp: 98.5 F (36.9 C)  SpO2: 91%  Weight: 192 lb 3.2 oz (87.2 kg)  Height: 5' 4 (1.626 m)    Physical Exam Constitutional:      General: She is not in acute distress.    Appearance: She is obese. She is not ill-appearing.  HENT:     Head: Normocephalic and atraumatic.  Eyes:     General: No scleral icterus.    Conjunctiva/sclera: Conjunctivae normal.  Cardiovascular:     Rate and  Rhythm: Normal rate and regular rhythm.     Pulses: Normal pulses.     Heart sounds: Normal heart sounds. No murmur heard. Pulmonary:     Effort: Pulmonary effort is normal.     Breath sounds: Normal breath sounds. No wheezing, rhonchi or rales.  Musculoskeletal:     Right lower leg: No edema.     Left lower leg: No edema.  Skin:    General: Skin is warm and dry.  Neurological:     Mental Status: She is alert.    CBC    Component Value Date/Time   WBC 8.4 02/20/2024 1300   RBC 5.89 (H) 02/20/2024 1300   HGB 12.9 02/20/2024 1300   HGB 13.8 02/21/2021 1428   HCT 45.0 02/20/2024 1300   HCT 46.2 02/21/2021 1428   PLT 220 02/20/2024 1300   PLT 276 02/21/2021 1428   MCV 76.4 (L) 02/20/2024 1300   MCV 73 (L) 02/21/2021 1428   MCH 21.9 (L) 02/20/2024 1300   MCHC 28.7 (L) 02/20/2024 1300   RDW 15.6 (H) 02/20/2024 1300   RDW 14.9 02/21/2021 1428   LYMPHSABS 2.8 02/20/2024 1300   MONOABS 0.4 02/20/2024 1300   EOSABS 0.1 02/20/2024 1300   BASOSABS 0.0 02/20/2024 1300   Chest imaging: CXR 02/20/24 Lower lung volumes with streaky bibasilar atelectasis. No focal airspace consolidation, pleural effusion, or pneumothorax. No cardiomegaly. No acute fracture or destructive lesion.  PFT:    Latest Ref Rng & Units 11/12/2020   10:27 AM  PFT Results  FVC-Pre L 1.81   FVC-Predicted Pre % 82   FVC-Post L 1.98   FVC-Predicted Post % 89   Pre FEV1/FVC % % 83   Post FEV1/FCV % % 83   FEV1-Pre L 1.50   FEV1-Predicted Pre % 88   FEV1-Post L 1.65   DLCO uncorrected ml/min/mmHg 14.03   DLCO UNC% % 73   DLCO corrected ml/min/mmHg 14.03   DLCO COR %Predicted % 73   DLVA Predicted % 107   TLC L 3.58   TLC % Predicted % 70  RV % Predicted % 71   PFTs 2023 mild restriction and mild diffusion defect     Assessment & Plan:   Pulmonary emphysema, unspecified emphysema type (HCC)  Discussion: Karen Black is a 78 year old woman, former smoker with diabetes and sleep apnea on CPAP who  returns to pulmonary clinic for chronic hypoxemic respiratory failure and emphysema.   Chronic Obstructive Pulmonary Disease (COPD) -continue stiolto inhaler 2 puffs daily -continue albuterol  inhaler as needed  Chronic Hypoxemic Respiratory failure - use 3L pulsed via POC with ambulation based on simple walk from last visit. Again, stressed the importance of using her oxygen  - continue 3L of oxygen  with CPAP at night - no need to use oxygen  at rest  Follow up in 6 months  Dorn Chill, MD [facility] Office: [phone]   Current Outpatient Medications:    albuterol  (VENTOLIN  HFA) 108 (90 Base) MCG/ACT inhaler, Inhale 2 puffs into the lungs every 6 (six) hours as needed for wheezing or shortness of breath., Disp: 8 g, Rfl: 1   amLODipine  (NORVASC ) 10 MG tablet, Take 10 mg by mouth in the morning., Disp: , Rfl:    atorvastatin (LIPITOR) 20 MG tablet, Take 20 mg by mouth in the morning., Disp: , Rfl:    Cyanocobalamin (VITAMIN B-12 PO), Take 1,000 mcg by mouth in the morning., Disp: , Rfl:    enalapril (VASOTEC) 20 MG tablet, Take 40 mg by mouth in the morning., Disp: , Rfl:    levothyroxine (SYNTHROID, LEVOTHROID) 75 MCG tablet, Take 75 mcg by mouth daily before breakfast., Disp: , Rfl:    metFORMIN (GLUCOPHAGE) 500 MG tablet, Take 1,000 mg by mouth daily with breakfast., Disp: , Rfl:    metoprolol  succinate (TOPROL -XL) 50 MG 24 hr tablet, Take 1.5 tablets (75 mg total) by mouth daily., Disp: 135 tablet, Rfl: 3   Multiple Vitamins-Minerals (CENTRUM SILVER) CHEW, Chew 1 tablet by mouth in the morning., Disp: , Rfl:    Omega-3 Fatty Acids (OMEGA-3 FISH OIL PO), Take 1,000 mg by mouth in the morning., Disp: , Rfl:    Tiotropium Bromide-Olodaterol (STIOLTO RESPIMAT ) 2.5-2.5 MCG/ACT AERS, Inhale 2 puffs into the lungs daily., Disp: 12 g, Rfl: 4   triamterene-hydrochlorothiazide (MAXZIDE-25) 37.5-25 MG tablet, Take 1 tablet by mouth in the morning., Disp: , Rfl:     doxycycline  (VIBRAMYCIN ) 100 MG capsule, Take 1 capsule (100 mg total) by mouth 2 (two) times daily. (Patient not taking: Reported on 03/07/2024), Disp: 20 capsule, Rfl: 0

## 2024-03-14 ENCOUNTER — Other Ambulatory Visit: Payer: Self-pay | Admitting: Family Medicine

## 2024-03-14 DIAGNOSIS — Z1231 Encounter for screening mammogram for malignant neoplasm of breast: Secondary | ICD-10-CM

## 2024-03-16 DIAGNOSIS — J439 Emphysema, unspecified: Secondary | ICD-10-CM | POA: Diagnosis not present

## 2024-03-16 DIAGNOSIS — I10 Essential (primary) hypertension: Secondary | ICD-10-CM | POA: Diagnosis not present

## 2024-03-16 DIAGNOSIS — E890 Postprocedural hypothyroidism: Secondary | ICD-10-CM | POA: Diagnosis not present

## 2024-03-25 ENCOUNTER — Other Ambulatory Visit: Payer: Self-pay | Admitting: Urology

## 2024-03-25 DIAGNOSIS — D4102 Neoplasm of uncertain behavior of left kidney: Secondary | ICD-10-CM

## 2024-03-25 DIAGNOSIS — N281 Cyst of kidney, acquired: Secondary | ICD-10-CM

## 2024-04-01 ENCOUNTER — Ambulatory Visit
Admission: RE | Admit: 2024-04-01 | Discharge: 2024-04-01 | Disposition: A | Discharge status: Home / Self Care | Source: Ambulatory Visit | Attending: Family Medicine | Admitting: Family Medicine

## 2024-04-01 DIAGNOSIS — Z1231 Encounter for screening mammogram for malignant neoplasm of breast: Secondary | ICD-10-CM

## 2024-04-11 DIAGNOSIS — E1169 Type 2 diabetes mellitus with other specified complication: Secondary | ICD-10-CM | POA: Diagnosis not present

## 2024-04-11 DIAGNOSIS — G4733 Obstructive sleep apnea (adult) (pediatric): Secondary | ICD-10-CM | POA: Diagnosis not present

## 2024-04-11 DIAGNOSIS — J439 Emphysema, unspecified: Secondary | ICD-10-CM | POA: Diagnosis not present

## 2024-04-11 DIAGNOSIS — I10 Essential (primary) hypertension: Secondary | ICD-10-CM | POA: Diagnosis not present

## 2024-04-11 DIAGNOSIS — N1831 Chronic kidney disease, stage 3a: Secondary | ICD-10-CM | POA: Diagnosis not present

## 2024-04-11 DIAGNOSIS — E890 Postprocedural hypothyroidism: Secondary | ICD-10-CM | POA: Diagnosis not present

## 2024-04-11 DIAGNOSIS — E785 Hyperlipidemia, unspecified: Secondary | ICD-10-CM | POA: Diagnosis not present

## 2024-04-15 DIAGNOSIS — I10 Essential (primary) hypertension: Secondary | ICD-10-CM | POA: Diagnosis not present

## 2024-04-15 DIAGNOSIS — J439 Emphysema, unspecified: Secondary | ICD-10-CM | POA: Diagnosis not present

## 2024-04-15 DIAGNOSIS — E890 Postprocedural hypothyroidism: Secondary | ICD-10-CM | POA: Diagnosis not present

## 2024-04-17 NOTE — Progress Notes (Signed)
 The patient attended a screening event on 02/16/2024 her BP screening results was 136/83, blood glucose was 114, A1c was 6.0. At the event the patient noted she has [employer] and does not smoke. Patient did not have any SDOH insecurities. Pt listed pcp as Dr. Vernell Fort at [facility]. At the screening event pt was instructed to speak with her pcp at her upcoming appt about her blood glucose, BP, and Cholesterol results by the clinician. Per chart review pt has a pcp and the last office visit was 09/19/2023 for essential hypertension. Pt is also being seen by Debby DELENA Sor a Cardiologist at [facility].    According to chart pt is currently on amlodipine  and metformin to manage chronic illness. Chart review also indicates a future appt with Cardiologist on 07/25/2024 . No additional Health equity team support indicated at this time.

## 2024-04-30 DIAGNOSIS — H35363 Drusen (degenerative) of macula, bilateral: Secondary | ICD-10-CM | POA: Diagnosis not present

## 2024-04-30 DIAGNOSIS — H43811 Vitreous degeneration, right eye: Secondary | ICD-10-CM | POA: Diagnosis not present

## 2024-04-30 DIAGNOSIS — H0520 Unspecified exophthalmos: Secondary | ICD-10-CM | POA: Diagnosis not present

## 2024-04-30 DIAGNOSIS — H0102B Squamous blepharitis left eye, upper and lower eyelids: Secondary | ICD-10-CM | POA: Diagnosis not present

## 2024-04-30 DIAGNOSIS — H26491 Other secondary cataract, right eye: Secondary | ICD-10-CM | POA: Diagnosis not present

## 2024-04-30 DIAGNOSIS — E119 Type 2 diabetes mellitus without complications: Secondary | ICD-10-CM | POA: Diagnosis not present

## 2024-04-30 DIAGNOSIS — H0102A Squamous blepharitis right eye, upper and lower eyelids: Secondary | ICD-10-CM | POA: Diagnosis not present

## 2024-04-30 DIAGNOSIS — H11133 Conjunctival pigmentations, bilateral: Secondary | ICD-10-CM | POA: Diagnosis not present

## 2024-05-20 ENCOUNTER — Ambulatory Visit
Admission: RE | Admit: 2024-05-20 | Discharge: 2024-05-20 | Disposition: A | Discharge status: Home / Self Care | Source: Ambulatory Visit | Attending: Urology | Admitting: Urology

## 2024-05-20 DIAGNOSIS — N281 Cyst of kidney, acquired: Secondary | ICD-10-CM

## 2024-05-20 DIAGNOSIS — D4102 Neoplasm of uncertain behavior of left kidney: Secondary | ICD-10-CM

## 2024-05-20 MED ORDER — GADOPICLENOL 0.5 MMOL/ML IV SOLN
9.0000 mL | Freq: Once | INTRAVENOUS | Status: AC | PRN
  Administered 2024-05-20: 9 mL via INTRAVENOUS

## 2024-05-21 ENCOUNTER — Other Ambulatory Visit

## 2024-05-21 ENCOUNTER — Ambulatory Visit (HOSPITAL_BASED_OUTPATIENT_CLINIC_OR_DEPARTMENT_OTHER)
Admission: RE | Admit: 2024-05-21 | Discharge: 2024-05-21 | Disposition: A | Discharge status: Home / Self Care | Source: Ambulatory Visit | Attending: Family Medicine | Admitting: Family Medicine

## 2024-05-21 DIAGNOSIS — E2839 Other primary ovarian failure: Secondary | ICD-10-CM

## 2024-07-07 ENCOUNTER — Encounter: Payer: Self-pay | Admitting: Adult Health

## 2024-07-07 ENCOUNTER — Ambulatory Visit: Payer: Self-pay | Admitting: Pulmonary Disease

## 2024-07-07 ENCOUNTER — Telehealth: Payer: Self-pay | Admitting: Pulmonary Disease

## 2024-07-07 DIAGNOSIS — J441 Chronic obstructive pulmonary disease with (acute) exacerbation: Secondary | ICD-10-CM

## 2024-07-07 MED ORDER — AZITHROMYCIN 250 MG PO TABS: ORAL_TABLET | ORAL | 0 refills | Status: DC

## 2024-07-07 MED ORDER — PREDNISONE 10 MG PO TABS: ORAL_TABLET | ORAL | 0 refills | Status: AC

## 2024-07-07 MED ORDER — AZITHROMYCIN 250 MG PO TABS: ORAL_TABLET | ORAL | 0 refills | Status: AC

## 2024-07-07 NOTE — Telephone Encounter (Signed)
 FYI Only or Action Required?: Action required by provider: patient requesting medication prior to visit 07/21/24.  Patient is followed in Pulmonology for COPD, last seen on 03/07/2024 by Karen Dorn NOVAK, MD.  Called Nurse Triage reporting Cough.  Symptoms began several weeks ago.  Interventions attempted: Prescription medications: benzonatate .  Symptoms are: unchanged.  Karen Black Pulmonary Triage - Initial Assessment Questions Chief Complaint (e.g., cough, sob, wheezing, fever, chills, sweat or additional symptoms) *Go to specific symptom protocol after initial questions. Cough non productive/productive at times   How long have symptoms been present? Since 06/10/24  Have you tested for COVID or Flu? Note: If not, ask patient if a home test can be taken. If so, instruct patient to call back for positive results. Yes  MEDICINES:   Have you used any OTC meds to help with symptoms? No If yes, ask What medications? But tried benzonatate from ER 12/19  Have you used your inhalers/maintenance medication? Yes If yes, What medications? Has albuterol  has not needed this, and uses stiolto inhaler 2 puffs daily   If inhaler, ask How many puffs and how often? Note: Review instructions on medication in the chart. Per chart 2 puffs daily   OXYGEN : Do you wear supplemental oxygen ? No If yes, How many liters are you supposed to use? Uses oxygen   in cpap at night, does not use oxygen  during the day   Do you monitor your oxygen  levels? No If yes, What is your reading (oxygen  level) today? Does not check    What is your usual oxygen  saturation reading?  (Note: Pulmonary O2 sats should be 90% or greater) N/a   Triage Disposition: Home Care (overriding See HCP Within 4 Hours (Or PCP Triage))  Patient/caregiver understands and will follow disposition?: Yes    Patient is out of state in [place] . Will be returning 12/28. Was seen ER for cough 12/19. Reports not improving  since ER. Not getting worse since ER. Wanting to make a follow up appt daughter insisted she call , booked next available with pulmonary office 07/21/24 offered sooner than other pulm office pt declined. Patient agreeable to seek UC/ER if worsening prior to appt while out of state.  Pt requesting medication in the meantime please advise . Patient call back is [phone]. [facility] [address] [place] [address]      Reason for Disposition  [1] MILD difficulty breathing (e.g., minimal/no SOB at rest, SOB with walking, pulse < 100) AND [2] still present when not coughing  (Exception: No change from usual, chronic shortness of breath.)  Protocols used: Cough - Chronic-A-AH     1. ONSET: When did the cough begin?      2 days after thanksgiving . Seen in ER 12/19 had Cxr done states they said it was clear was given benzonatate which hasn't improved the cough  2. SEVERITY: How bad is the cough today?      Today  , has had severe coughing fits and gags on the phlegm , cough can get severe , worse at night  3. SPUTUM: Describe the color of your sputum (e.g., none, dry cough; clear, white, yellow, green)     Sometimes productive , sometimes dry  4. HEMOPTYSIS: Are you coughing up any blood? If so ask: How much? (e.g., flecks, streaks, tablespoons, etc.)     Denies  5. DIFFICULTY BREATHING: Are you having difficulty breathing? If Yes, ask: How bad is it? (e.g., mild, moderate, severe)  Denies difficulty breahting at rest. Has difficulty breathing at baseline with exertion this has not increased since ER visit 12/19. Able to walk around in home shower make meals do ADLs 6. FEVER: Do you have a fever? If Yes, ask: What is your temperature, how was it measured, and when did it start?     Denies  7. CARDIAC HISTORY: Do you have any history of heart disease? (e.g., heart attack, congestive heart failure)      Denies  8. LUNG HISTORY: Do you have any history of lung  disease?  (e.g., pulmonary embolus, asthma, emphysema)     Pt reports copd , sleep apnea has CPAP . Pt seeing Dr Karen pulmonary  9. PE RISK FACTORS: Do you have a history of blood clots? (or: recent major surgery, recent prolonged travel, bedridden)     Left [place] 11/19 12 hours traveling via train  10. OTHER SYMPTOMS: Do you have any other symptoms? (e.g., runny nose, wheezing, chest pain) Patient reports can take breaths           Denies the following chest pain,  difficulty breathing at rest ,dizziness ,wheezing

## 2024-07-07 NOTE — Telephone Encounter (Signed)
 Called Madelin Stank, NP on call. Advised Dr. Kara sent in zpack and prednisone  per [facility] [place], but patient would like it sent to [facility] [place]. She says she will reach out to the patient. Updated preferred pharmacy list.   Copied from CRM [id number]. Topic: Clinical - Pink Word Triage >> Jul 07, 2024  6:12 PM Rozanna G wrote: Pt calling back because the [facility] [address] and [facility] [address] [place] [address]

## 2024-07-07 NOTE — Telephone Encounter (Signed)
 Sent in Zpak and Steroid taper for possible COPD exacerbation.   [facility] [address].   If she is continuing to have issues, then she needs to call back for an acute visit.  Thanks, JD

## 2024-07-07 NOTE — Telephone Encounter (Signed)
 Message from Alden G sent at 07/07/2024 11:38 AM EST  Reason for Triage: coughing, and coughing up yellow mucus sometimes. Pt is currently out of town.    Phone call placed to patient-no answer. Voicemail left for patient to call back to Nurse Triage.

## 2024-07-08 NOTE — Progress Notes (Signed)
 After hours call -Rx for Zpack and Steroids called into wrong pharmacy. Pharmacy of choice rx sent per request  Please contact office for sooner follow up if symptoms do not improve or worsen or seek emergency care

## 2024-07-08 NOTE — Telephone Encounter (Signed)
 Called and spoke with the pt and advised Pred & azithro was sent to pharmacy in [place] 12/22. Pt states she is aware but insurance is not letting her pick up rx due to not covering cost.  Pt is going to contact her insurance company.

## 2024-07-20 NOTE — Progress Notes (Signed)
 "  Synopsis: Referred in March 2022 by Lauraine Lites, NP for low oxygen  saturations  Subjective:   PATIENT ID: Karen Black GENDER: female DOB: [birth year], MRN: [id number]  HPI  No chief complaint on file.  Alaena Strader is a 79 year old woman, former smoker with diabetes and sleep apnea on CPAP who returns to pulmonary clinic for chronic respiratory failure and COPD.  She has been doing well since last visit. Her CPAP compliance is great with 100% use and all days over 4 hours of use. She is using stiolto 2 puffs daily. She has not been using her POC as discussed during last visit, reviewed the importance of this again today.  She was treated for COPD exacerbation earlier this month in ER. Reports breathing has been fine since.      Past Medical History:  Diagnosis Date   Diabetes (HCC)    Dyslipidemia    Hypothyroidism    Sleep apnea      Family History  Problem Relation Age of Onset   Breast cancer Sister 14     Social History   Socioeconomic History   Marital status: Divorced    Spouse name: Not on file   Number of children: Not on file   Years of education: Not on file   Highest education level: Not on file  Occupational History   Not on file  Tobacco Use   Smoking status: Former   Smokeless tobacco: Never  Substance and Sexual Activity   Alcohol use: No   Drug use: Not on file   Sexual activity: Not on file  Other Topics Concern   Not on file  Social History Narrative   Not on file   Social Drivers of Health   Tobacco Use: Medium Risk (03/07/2024)   Patient History    Smoking Tobacco Use: Former    Smokeless Tobacco Use: Never    Passive Exposure: Not on Actuary Strain: Not on file  Food Insecurity: No Food Insecurity (02/16/2024)   Epic    Worried About Radiation Protection Practitioner of Food in the Last Year: Never true    Ran Out of Food in the Last Year: Never true  Transportation Needs: No Transportation Needs (02/16/2024)   Epic    Lack of  Transportation (Medical): No    Lack of Transportation (Non-Medical): No  Physical Activity: Not on file  Stress: Not on file  Social Connections: Unknown (11/14/2021)   Received from [facility]   Social Network    Social Network: Not on file  Intimate Partner Violence: Not At Risk (02/16/2024)   Epic    Fear of Current or Ex-Partner: No    Emotionally Abused: No    Physically Abused: No    Sexually Abused: No  Depression (PHQ2-9): Not on file  Alcohol Screen: Not on file  Housing: Low Risk (02/16/2024)   Epic    Unable to Pay for Housing in the Last Year: No    Number of Times Moved in the Last Year: 0    Homeless in the Last Year: No  Utilities: Not At Risk (02/16/2024)   Epic    Threatened with loss of utilities: No  Health Literacy: Not on file     No Known Allergies   Outpatient Medications Prior to Visit  Medication Sig Dispense Refill   albuterol  (VENTOLIN  HFA) 108 (90 Base) MCG/ACT inhaler Inhale 2 puffs into the lungs every 6 (six) hours as needed for wheezing or shortness of breath.  8 g 11   amLODipine  (NORVASC ) 10 MG tablet Take 10 mg by mouth in the morning.     atorvastatin (LIPITOR) 20 MG tablet Take 20 mg by mouth in the morning.     azithromycin  (ZITHROMAX ) 250 MG tablet Take as directed 6 tablet 0   Cyanocobalamin (VITAMIN B-12 PO) Take 1,000 mcg by mouth in the morning.     enalapril (VASOTEC) 20 MG tablet Take 40 mg by mouth in the morning.     levothyroxine (SYNTHROID, LEVOTHROID) 75 MCG tablet Take 75 mcg by mouth daily before breakfast.     metFORMIN (GLUCOPHAGE) 500 MG tablet Take 1,000 mg by mouth daily with breakfast.     metoprolol  succinate (TOPROL -XL) 50 MG 24 hr tablet Take 1.5 tablets (75 mg total) by mouth daily. 135 tablet 3   Multiple Vitamins-Minerals (CENTRUM SILVER) CHEW Chew 1 tablet by mouth in the morning.     Omega-3 Fatty Acids (OMEGA-3 FISH OIL PO) Take 1,000 mg by mouth in the morning.     predniSONE  (DELTASONE ) 10 MG tablet 4 tabs for 2  days, then 3 tabs for 2 days, 2 tabs for 2 days, then 1 tab for 2 days, then stop 20 tablet 0   Tiotropium Bromide-Olodaterol (STIOLTO RESPIMAT ) 2.5-2.5 MCG/ACT AERS Inhale 2 puffs into the lungs daily. 12 g 11   triamterene-hydrochlorothiazide (MAXZIDE-25) 37.5-25 MG tablet Take 1 tablet by mouth in the morning.     No facility-administered medications prior to visit.    Review of Systems  Constitutional:  Negative for chills, fever, malaise/fatigue and weight loss.  HENT:  Negative for congestion, sinus pain and sore throat.   Eyes: Negative.   Respiratory:  Positive for shortness of breath. Negative for cough, hemoptysis, sputum production and wheezing.   Cardiovascular:  Negative for chest pain, palpitations, orthopnea, claudication and leg swelling.  Gastrointestinal:  Negative for abdominal pain, heartburn, nausea and vomiting.  Genitourinary: Negative.   Musculoskeletal:  Negative for joint pain and myalgias.  Skin:  Negative for rash.  Neurological:  Negative for weakness.  Endo/Heme/Allergies: Negative.   Psychiatric/Behavioral: Negative.      Objective:   There were no vitals filed for this visit.   Physical Exam Constitutional:      General: She is not in acute distress.    Appearance: She is obese. She is not ill-appearing.  HENT:     Head: Normocephalic and atraumatic.  Eyes:     General: No scleral icterus.    Conjunctiva/sclera: Conjunctivae normal.  Cardiovascular:     Rate and Rhythm: Normal rate and regular rhythm.     Pulses: Normal pulses.     Heart sounds: Normal heart sounds. No murmur heard. Pulmonary:     Effort: Pulmonary effort is normal.     Breath sounds: Normal breath sounds. No wheezing, rhonchi or rales.  Musculoskeletal:     Right lower leg: No edema.     Left lower leg: No edema.  Skin:    General: Skin is warm and dry.  Neurological:     Mental Status: She is alert.    CBC    Component Value Date/Time   WBC 8.4 02/20/2024 1300    RBC 5.89 (H) 02/20/2024 1300   HGB 12.9 02/20/2024 1300   HGB 13.8 02/21/2021 1428   HCT 45.0 02/20/2024 1300   HCT 46.2 02/21/2021 1428   PLT 220 02/20/2024 1300   PLT 276 02/21/2021 1428   MCV 76.4 (L) 02/20/2024 1300   MCV  73 (L) 02/21/2021 1428   MCH 21.9 (L) 02/20/2024 1300   MCHC 28.7 (L) 02/20/2024 1300   RDW 15.6 (H) 02/20/2024 1300   RDW 14.9 02/21/2021 1428   LYMPHSABS 2.8 02/20/2024 1300   MONOABS 0.4 02/20/2024 1300   EOSABS 0.1 02/20/2024 1300   BASOSABS 0.0 02/20/2024 1300   Chest imaging: CXR 02/20/24 Lower lung volumes with streaky bibasilar atelectasis. No focal airspace consolidation, pleural effusion, or pneumothorax. No cardiomegaly. No acute fracture or destructive lesion.  PFT:    Latest Ref Rng & Units 11/12/2020   10:27 AM  PFT Results  FVC-Pre L 1.81   FVC-Predicted Pre % 82   FVC-Post L 1.98   FVC-Predicted Post % 89   Pre FEV1/FVC % % 83   Post FEV1/FCV % % 83   FEV1-Pre L 1.50   FEV1-Predicted Pre % 88   FEV1-Post L 1.65   DLCO uncorrected ml/min/mmHg 14.03   DLCO UNC% % 73   DLCO corrected ml/min/mmHg 14.03   DLCO COR %Predicted % 73   DLVA Predicted % 107   TLC L 3.58   TLC % Predicted % 70   RV % Predicted % 71   PFTs 2022 mild restriction and mild diffusion defect, and significant response to BD 9% change     Assessment & Plan:   No diagnosis found.  Discussion: Zaylia Riolo is a 79 year old woman, former smoker with diabetes and sleep apnea on CPAP who returns to pulmonary clinic for chronic hypoxemic respiratory failure and emphysema.   Chronic Obstructive Pulmonary Disease (COPD) -continue stiolto inhaler 2 puffs daily -continue albuterol  inhaler as needed  Chronic Hypoxemic Respiratory failure - use 3L pulsed via POC with ambulation based on simple walk from last visit. Again, stressed the importance of using her oxygen  - continue 3L of oxygen  with CPAP at night - no need to use oxygen  at rest  Follow up in 6  months  Dorn Chill, MD [facility] Office: [phone]   Current Outpatient Medications:    albuterol  (VENTOLIN  HFA) 108 (90 Base) MCG/ACT inhaler, Inhale 2 puffs into the lungs every 6 (six) hours as needed for wheezing or shortness of breath., Disp: 8 g, Rfl: 11   amLODipine  (NORVASC ) 10 MG tablet, Take 10 mg by mouth in the morning., Disp: , Rfl:    atorvastatin (LIPITOR) 20 MG tablet, Take 20 mg by mouth in the morning., Disp: , Rfl:    azithromycin  (ZITHROMAX ) 250 MG tablet, Take as directed, Disp: 6 tablet, Rfl: 0   Cyanocobalamin (VITAMIN B-12 PO), Take 1,000 mcg by mouth in the morning., Disp: , Rfl:    enalapril (VASOTEC) 20 MG tablet, Take 40 mg by mouth in the morning., Disp: , Rfl:    levothyroxine (SYNTHROID, LEVOTHROID) 75 MCG tablet, Take 75 mcg by mouth daily before breakfast., Disp: , Rfl:    metFORMIN (GLUCOPHAGE) 500 MG tablet, Take 1,000 mg by mouth daily with breakfast., Disp: , Rfl:    metoprolol  succinate (TOPROL -XL) 50 MG 24 hr tablet, Take 1.5 tablets (75 mg total) by mouth daily., Disp: 135 tablet, Rfl: 3   Multiple Vitamins-Minerals (CENTRUM SILVER) CHEW, Chew 1 tablet by mouth in the morning., Disp: , Rfl:    Omega-3 Fatty Acids (OMEGA-3 FISH OIL PO), Take 1,000 mg by mouth in the morning., Disp: , Rfl:    predniSONE  (DELTASONE ) 10 MG tablet, 4 tabs for 2 days, then 3 tabs for 2 days, 2 tabs for 2 days, then  1 tab for 2 days, then stop, Disp: 20 tablet, Rfl: 0   Tiotropium Bromide-Olodaterol (STIOLTO RESPIMAT ) 2.5-2.5 MCG/ACT AERS, Inhale 2 puffs into the lungs daily., Disp: 12 g, Rfl: 11   triamterene-hydrochlorothiazide (MAXZIDE-25) 37.5-25 MG tablet, Take 1 tablet by mouth in the morning., Disp: , Rfl:    "

## 2024-07-21 ENCOUNTER — Ambulatory Visit

## 2024-07-21 DIAGNOSIS — J449 Chronic obstructive pulmonary disease, unspecified: Secondary | ICD-10-CM

## 2024-07-21 DIAGNOSIS — Z87891 Personal history of nicotine dependence: Secondary | ICD-10-CM

## 2024-07-21 DIAGNOSIS — J9611 Chronic respiratory failure with hypoxia: Secondary | ICD-10-CM | POA: Diagnosis not present

## 2024-07-21 MED ORDER — STIOLTO RESPIMAT 2.5-2.5 MCG/ACT IN AERS: 2.0000 | INHALATION_SPRAY | Freq: Every day | RESPIRATORY_TRACT | 11 refills | Status: AC

## 2024-07-25 ENCOUNTER — Ambulatory Visit (HOSPITAL_COMMUNITY)
Admission: RE | Admit: 2024-07-25 | Discharge: 2024-07-25 | Disposition: A | Source: Ambulatory Visit | Attending: Cardiovascular Disease | Admitting: Cardiovascular Disease

## 2024-07-25 DIAGNOSIS — Q2112 Patent foramen ovale: Secondary | ICD-10-CM | POA: Insufficient documentation

## 2024-07-25 LAB — ECHOCARDIOGRAM COMPLETE: S' Lateral: 2.48 cm

## 2024-07-28 ENCOUNTER — Ambulatory Visit: Payer: Self-pay | Admitting: Cardiology

## 2024-08-14 ENCOUNTER — Ambulatory Visit (HOSPITAL_BASED_OUTPATIENT_CLINIC_OR_DEPARTMENT_OTHER)
Admission: RE | Admit: 2024-08-14 | Discharge: 2024-08-14 | Disposition: A | Discharge status: Home / Self Care | Source: Ambulatory Visit | Attending: Family Medicine | Admitting: Family Medicine

## 2024-08-14 DIAGNOSIS — E2839 Other primary ovarian failure: Secondary | ICD-10-CM | POA: Diagnosis present

## 2024-09-11 ENCOUNTER — Ambulatory Visit: Admitting: Cardiology

## 2024-10-10 ENCOUNTER — Ambulatory Visit: Admitting: Pulmonary Disease

## 2024-10-21 ENCOUNTER — Ambulatory Visit: Admitting: Pulmonary Disease
